# Patient Record
Sex: Male | Born: 1935 | Race: White | Hispanic: No | Marital: Married | Smoking: Former smoker
Health system: Southern US, Community
[De-identification: ages and names within clinical notes are randomized; demographics above are authoritative.]

## PROBLEM LIST (undated history)

## (undated) DIAGNOSIS — Z950 Presence of cardiac pacemaker: Secondary | ICD-10-CM

## (undated) DIAGNOSIS — K4090 Unilateral inguinal hernia, without obstruction or gangrene, not specified as recurrent: Secondary | ICD-10-CM

## (undated) DIAGNOSIS — R32 Unspecified urinary incontinence: Secondary | ICD-10-CM

## (undated) DIAGNOSIS — Z85828 Personal history of other malignant neoplasm of skin: Secondary | ICD-10-CM

## (undated) DIAGNOSIS — E785 Hyperlipidemia, unspecified: Secondary | ICD-10-CM

## (undated) DIAGNOSIS — H409 Unspecified glaucoma: Secondary | ICD-10-CM

## (undated) DIAGNOSIS — G510 Bell's palsy: Secondary | ICD-10-CM

## (undated) DIAGNOSIS — R972 Elevated prostate specific antigen [PSA]: Secondary | ICD-10-CM

## (undated) DIAGNOSIS — I10 Essential (primary) hypertension: Secondary | ICD-10-CM

## (undated) DIAGNOSIS — I495 Sick sinus syndrome: Secondary | ICD-10-CM

## (undated) DIAGNOSIS — K5900 Constipation, unspecified: Secondary | ICD-10-CM

## (undated) HISTORY — DX: Sick sinus syndrome: I49.5

## (undated) HISTORY — DX: Elevated prostate specific antigen (PSA): R97.20

## (undated) HISTORY — DX: Unspecified urinary incontinence: R32

## (undated) HISTORY — DX: Essential (primary) hypertension: I10

## (undated) HISTORY — DX: Constipation, unspecified: K59.00

## (undated) HISTORY — DX: Presence of cardiac pacemaker: Z95.0

## (undated) HISTORY — DX: Hyperlipidemia, unspecified: E78.5

## (undated) HISTORY — DX: Personal history of other malignant neoplasm of skin: Z85.828

## (undated) HISTORY — DX: Unilateral inguinal hernia, without obstruction or gangrene, not specified as recurrent: K40.90

## (undated) HISTORY — DX: Unspecified glaucoma: H40.9

## (undated) HISTORY — DX: Bell's palsy: G51.0

---

## 1995-11-28 HISTORY — PX: HERNIA REPAIR: SHX51

## 2005-06-29 ENCOUNTER — Encounter: Payer: Self-pay | Admitting: Family Medicine

## 2006-04-27 HISTORY — PX: CHOLECYSTECTOMY: SHX55

## 2006-04-27 HISTORY — PX: PACEMAKER PLACEMENT: SHX43

## 2006-04-27 HISTORY — PX: ABDOMINAL AORTIC ANEURYSM REPAIR: SUR1152

## 2006-04-28 ENCOUNTER — Ambulatory Visit: Payer: Self-pay | Admitting: Internal Medicine

## 2006-04-28 ENCOUNTER — Inpatient Hospital Stay (HOSPITAL_COMMUNITY): Admission: EM | Admit: 2006-04-28 | Discharge: 2006-05-11 | Payer: Self-pay | Admitting: Emergency Medicine

## 2006-04-29 ENCOUNTER — Encounter (INDEPENDENT_AMBULATORY_CARE_PROVIDER_SITE_OTHER): Payer: Self-pay | Admitting: Cardiovascular Disease

## 2006-05-01 ENCOUNTER — Encounter: Payer: Self-pay | Admitting: Vascular Surgery

## 2006-05-03 ENCOUNTER — Encounter (INDEPENDENT_AMBULATORY_CARE_PROVIDER_SITE_OTHER): Payer: Self-pay | Admitting: *Deleted

## 2006-05-17 ENCOUNTER — Ambulatory Visit: Payer: Self-pay

## 2006-05-18 ENCOUNTER — Emergency Department (HOSPITAL_COMMUNITY): Admission: EM | Admit: 2006-05-18 | Discharge: 2006-05-18 | Payer: Self-pay | Admitting: Emergency Medicine

## 2006-08-07 ENCOUNTER — Ambulatory Visit: Payer: Self-pay | Admitting: Internal Medicine

## 2007-04-30 ENCOUNTER — Ambulatory Visit: Payer: Self-pay | Admitting: Internal Medicine

## 2007-07-30 ENCOUNTER — Ambulatory Visit: Payer: Self-pay | Admitting: Internal Medicine

## 2007-10-30 ENCOUNTER — Ambulatory Visit: Payer: Self-pay | Admitting: Internal Medicine

## 2007-11-28 HISTORY — PX: PROSTATE SURGERY: SHX751

## 2008-01-29 ENCOUNTER — Ambulatory Visit: Payer: Self-pay | Admitting: Internal Medicine

## 2008-03-23 ENCOUNTER — Ambulatory Visit (HOSPITAL_COMMUNITY): Admission: RE | Admit: 2008-03-23 | Discharge: 2008-03-23 | Payer: Self-pay | Admitting: Urology

## 2008-04-27 ENCOUNTER — Ambulatory Visit: Payer: Self-pay

## 2008-12-18 ENCOUNTER — Ambulatory Visit (HOSPITAL_BASED_OUTPATIENT_CLINIC_OR_DEPARTMENT_OTHER): Admission: RE | Admit: 2008-12-18 | Discharge: 2008-12-19 | Payer: Self-pay | Admitting: Urology

## 2009-01-28 ENCOUNTER — Encounter: Payer: Self-pay | Admitting: Internal Medicine

## 2009-02-02 ENCOUNTER — Ambulatory Visit: Payer: Self-pay | Admitting: Internal Medicine

## 2009-05-05 ENCOUNTER — Ambulatory Visit: Payer: Self-pay | Admitting: Internal Medicine

## 2009-05-12 ENCOUNTER — Encounter: Payer: Self-pay | Admitting: Internal Medicine

## 2009-07-09 DIAGNOSIS — G510 Bell's palsy: Secondary | ICD-10-CM

## 2009-07-09 DIAGNOSIS — E785 Hyperlipidemia, unspecified: Secondary | ICD-10-CM

## 2009-07-09 DIAGNOSIS — Z87898 Personal history of other specified conditions: Secondary | ICD-10-CM | POA: Insufficient documentation

## 2009-07-09 DIAGNOSIS — I10 Essential (primary) hypertension: Secondary | ICD-10-CM

## 2009-07-09 DIAGNOSIS — H409 Unspecified glaucoma: Secondary | ICD-10-CM | POA: Insufficient documentation

## 2009-07-09 DIAGNOSIS — R972 Elevated prostate specific antigen [PSA]: Secondary | ICD-10-CM

## 2009-07-09 HISTORY — DX: Unspecified glaucoma: H40.9

## 2009-07-09 HISTORY — DX: Bell's palsy: G51.0

## 2009-07-09 HISTORY — DX: Hyperlipidemia, unspecified: E78.5

## 2009-07-09 HISTORY — DX: Essential (primary) hypertension: I10

## 2009-07-09 HISTORY — DX: Elevated prostate specific antigen (PSA): R97.20

## 2009-08-04 ENCOUNTER — Ambulatory Visit: Payer: Self-pay | Admitting: Internal Medicine

## 2009-08-06 ENCOUNTER — Ambulatory Visit: Payer: Self-pay | Admitting: Family Medicine

## 2009-08-06 DIAGNOSIS — K5900 Constipation, unspecified: Secondary | ICD-10-CM | POA: Insufficient documentation

## 2009-08-06 DIAGNOSIS — Z85828 Personal history of other malignant neoplasm of skin: Secondary | ICD-10-CM

## 2009-08-06 DIAGNOSIS — I10 Essential (primary) hypertension: Secondary | ICD-10-CM

## 2009-08-06 DIAGNOSIS — K4090 Unilateral inguinal hernia, without obstruction or gangrene, not specified as recurrent: Secondary | ICD-10-CM

## 2009-08-06 DIAGNOSIS — R32 Unspecified urinary incontinence: Secondary | ICD-10-CM

## 2009-08-06 HISTORY — DX: Personal history of other malignant neoplasm of skin: Z85.828

## 2009-08-06 HISTORY — DX: Essential (primary) hypertension: I10

## 2009-08-06 HISTORY — DX: Unilateral inguinal hernia, without obstruction or gangrene, not specified as recurrent: K40.90

## 2009-08-06 HISTORY — DX: Constipation, unspecified: K59.00

## 2009-08-06 HISTORY — DX: Unspecified urinary incontinence: R32

## 2009-08-19 ENCOUNTER — Encounter: Payer: Self-pay | Admitting: Internal Medicine

## 2009-09-02 ENCOUNTER — Ambulatory Visit: Payer: Self-pay | Admitting: Family Medicine

## 2009-09-06 LAB — CONVERTED CEMR LAB
BUN: 21 mg/dL (ref 6–23)
CO2: 40 meq/L — ABNORMAL HIGH (ref 19–32)
Calcium: 9.8 mg/dL (ref 8.4–10.5)
Creatinine, Ser: 1.4 mg/dL (ref 0.4–1.5)
GFR calc non Af Amer: 50.94 mL/min (ref >60)
Potassium: 3.4 meq/L — ABNORMAL LOW (ref 3.5–5.1)

## 2009-10-27 ENCOUNTER — Encounter (INDEPENDENT_AMBULATORY_CARE_PROVIDER_SITE_OTHER): Payer: Self-pay | Admitting: *Deleted

## 2009-11-03 ENCOUNTER — Encounter: Payer: Self-pay | Admitting: Internal Medicine

## 2009-11-11 ENCOUNTER — Encounter: Payer: Self-pay | Admitting: Internal Medicine

## 2010-01-05 ENCOUNTER — Ambulatory Visit: Payer: Self-pay | Admitting: Family Medicine

## 2010-02-22 ENCOUNTER — Ambulatory Visit: Payer: Self-pay | Admitting: Internal Medicine

## 2010-02-22 DIAGNOSIS — Z950 Presence of cardiac pacemaker: Secondary | ICD-10-CM

## 2010-02-22 HISTORY — DX: Presence of cardiac pacemaker: Z95.0

## 2010-05-25 ENCOUNTER — Ambulatory Visit: Payer: Self-pay | Admitting: Internal Medicine

## 2010-06-15 ENCOUNTER — Encounter: Payer: Self-pay | Admitting: Internal Medicine

## 2010-07-06 ENCOUNTER — Ambulatory Visit: Payer: Self-pay | Admitting: Family Medicine

## 2010-08-25 ENCOUNTER — Ambulatory Visit: Payer: Self-pay | Admitting: Internal Medicine

## 2010-10-12 ENCOUNTER — Encounter: Payer: Self-pay | Admitting: Internal Medicine

## 2010-12-01 ENCOUNTER — Encounter: Payer: Self-pay | Admitting: Internal Medicine

## 2010-12-01 ENCOUNTER — Ambulatory Visit
Admission: RE | Admit: 2010-12-01 | Discharge: 2010-12-01 | Payer: Self-pay | Source: Home / Self Care | Attending: Internal Medicine | Admitting: Internal Medicine

## 2010-12-20 ENCOUNTER — Encounter: Payer: Self-pay | Admitting: Family Medicine

## 2010-12-24 ENCOUNTER — Encounter: Payer: Self-pay | Admitting: Family Medicine

## 2010-12-27 NOTE — Cardiovascular Report (Signed)
Summary: Office Visit Remote   Office Visit Remote   Imported By: Roderic Ovens 06/16/2010 11:00:26  _____________________________________________________________________  External Attachment:    Type:   Image     Comment:   External Document

## 2010-12-27 NOTE — Assessment & Plan Note (Signed)
Summary: 4 MNTH ROV//SLM   Vital Signs:  Patient profile:   77 year old male Weight:      158 pounds Temp:     97.6 degrees F oral BP sitting:   140 / 58  (left arm) Cuff size:   regular  Vitals Entered By: Sid Falcon LPN (January 05, 2010 1:23 PM) CC: 4 month follow-up, Hypertension Management   History of Present Illness: Followup hypertension. Tolerating medications well. No orthostatic symptoms. Denies any palpitations or chest pain. Long history of recurrent skin cancers. Couple of places that have come up in the past month right thigh and right leg. Sees dermatologist monthly  Hypertension History:      He denies headache, chest pain, palpitations, dyspnea with exertion, orthopnea, PND, peripheral edema, visual symptoms, neurologic problems, syncope, and side effects from treatment.        Positive major cardiovascular risk factors include male age 41 years old or older, hyperlipidemia, and hypertension.  Negative major cardiovascular risk factors include non-tobacco-user status.     Allergies (verified): No Known Drug Allergies  Past History:  Past Medical History: Last updated: 08/06/2009 Skin cancer, hx of Hypertension Urinary incontinence Prostate cancer Fainting spells Glaucoma Blood Transfusion Heart murmur PMH reviewed for relevance  Review of Systems      See HPI  Physical Exam  General:  Well-developed,well-nourished,in no acute distress; alert,appropriate and cooperative throughout examination Neck:  No deformities, masses, or tenderness noted. Lungs:  Normal respiratory effort, chest expands symmetrically. Lungs are clear to auscultation, no crackles or wheezes. Heart:  heart sounds somewhat distant but regular with a rate around 70 Skin:  patient has fairly large eschar right lower leg and nodular erythematous lesion right lateral thigh with somewhat ulcerative center   Impression & Recommendations:  Problem # 1:  HYPERTENSION  (ICD-401.9) Assessment Unchanged  His updated medication list for this problem includes:    Cardura 8 Mg Tabs (Doxazosin mesylate) .Marland Kitchen... 1/2 tab daily  Problem # 2:  SKIN CANCER, HX OF (ICD-V10.83) patient has a couple of concerning lesions right leg and right thigh. He is encouraged to his dermatologist the next few weeks  Complete Medication List: 1)  Cardura 8 Mg Tabs (Doxazosin mesylate) .... 1/2 tab daily 2)  Timoptic-xe 0.25 % Solg (Timolol maleate) .... One drop to each eye daily 3)  Inderide 40/25  .... One tab daily  Hypertension Assessment/Plan:      The patient's hypertensive risk group is category B: At least one risk factor (excluding diabetes) with no target organ damage.  Today's blood pressure is 140/58.    Patient Instructions: 1)  Please schedule a follow-up appointment in 6 months .  2)  Schedule follow up with your dermatologist within the next few weeks

## 2010-12-27 NOTE — Letter (Signed)
Summary: Remote Device Check  [facility], Main Office  [address]   [place], [place] [address]   Phone: [phone]  Fax: [phone]     June 15, 2010 MRN: [id number]   SHAWNMICHAEL PARENTEAU [address] [place], [place]  [address]   Dear Mr. CRIST,   Your remote transmission was recieved and reviewed by your physician.  All diagnostics were within normal limits for you.  __X___Your next transmission is scheduled for:  08-25-2010.  Please transmit at any time this day.  If you have a wireless device your transmission will be sent automatically.   Sincerely,  Vella Kohler

## 2010-12-27 NOTE — Assessment & Plan Note (Signed)
Summary: 6 month fup//ccm   Vital Signs:  Patient profile:   77 year old male Height:      66.5 inches Weight:      162 pounds BMI:     25.85 Temp:     98.1 degrees F oral Pulse rate:   78 / minute BP sitting:   110 / 70  (left arm) BP standing:   122 / 60 Cuff size:   regular  Vitals Entered By: Romualdo Bolk, CMA (AAMA) (July 06, 2010 1:10 PM)  Serial Vital Signs/Assessments:  Time      Position  BP       Pulse  Resp  Temp     By           Standing  122/60                         Evelena Peat MD  CC: follow-up visit, Hypertension Management   History of Present Illness: Patient here for followup hypertension. He takes Inderide and Cardura. His Cardura was actually started for BPH issues. No orthostasis. Had recent elevated systolic blood pressures through cardiologist's office but has not had hx of signif elevation. Generally he feels well no dizziness or chest pains.  Hypertension History:      He denies headache, chest pain, palpitations, dyspnea with exertion, orthopnea, PND, peripheral edema, visual symptoms, neurologic problems, syncope, and side effects from treatment.  He notes no problems with any antihypertensive medication side effects.        Positive major cardiovascular risk factors include male age 68 years old or older, hyperlipidemia, and hypertension.  Negative major cardiovascular risk factors include non-tobacco-user status.     Preventive Screening-Counseling & Management  Alcohol-Tobacco     Alcohol drinks/day: 0     Smoking Status: quit     Year Quit: 1980  Caffeine-Diet-Exercise     Caffeine use/day: 0     Does Patient Exercise: yes  Current Medications (verified): 1)  Cardura 8 Mg Tabs (Doxazosin Mesylate) .... 1/2 Tab Daily 2)  Timoptic-Xe 0.25 % Solg (Timolol Maleate) .... One Drop To Each Eye Daily 3)  Inderide 40/25 .... One Tab Daily 4)  Fish Oil 1200 Mg Caps (Omega-3 Fatty Acids) .... Take One Capsule Three Times A Day 5)   Vitamin E 1000 Unit Caps (Vitamin E) .... Take One Capusle Daily 6)  Fergon 240 (27 Fe) Mg Tabs (Ferrous Gluconate) .... Take One Tablet Once Daily 7)  Potassium 99 Mg Tabs (Potassium) .... Take One Tablet Once Daily  Allergies (verified): No Known Drug Allergies  Past History:  Past Medical History: Last updated: 08/06/2009 Skin cancer, hx of Hypertension Urinary incontinence Prostate cancer Fainting spells Glaucoma Blood Transfusion Heart murmur PMH reviewed for relevance  Social History: Caffeine use/day:  0 Does Patient Exercise:  yes  Review of Systems      See HPI  Physical Exam  General:  Well-developed,well-nourished,in no acute distress; alert,appropriate and cooperative throughout examination Neck:  No deformities, masses, or tenderness noted. Lungs:  Normal respiratory effort, chest expands symmetrically. Lungs are clear to auscultation, no crackles or wheezes. Heart:  normal rate and regular rhythm.   Extremities:  no edema   Impression & Recommendations:  Problem # 1:  HYPERTENSION (ICD-401.9) adequately controlled by today's reading. I am reluctant to increase his medication further based on systolic reading today the 120 range and increased risk of orthostasis. He remains on Inderide in addition to  his Cardura His updated medication list for this problem includes:    Cardura 8 Mg Tabs (Doxazosin mesylate) .Marland Kitchen... 1/2 tab daily  Complete Medication List: 1)  Cardura 8 Mg Tabs (Doxazosin mesylate) .... 1/2 tab daily 2)  Timoptic-xe 0.25 % Solg (Timolol maleate) .... One drop to each eye daily 3)  Inderide 40/25  .... One tab daily 4)  Fish Oil 1200 Mg Caps (Omega-3 fatty acids) .... Take one capsule three times a day 5)  Vitamin E 1000 Unit Caps (Vitamin e) .... Take one capusle daily 6)  Fergon 240 (27 Fe) Mg Tabs (Ferrous gluconate) .... Take one tablet once daily 7)  Potassium 99 Mg Tabs (Potassium) .... Take one tablet once daily  Hypertension  Assessment/Plan:      The patient's hypertensive risk group is category B: At least one risk factor (excluding diabetes) with no target organ damage.  Today's blood pressure is 110/70.    Patient Instructions: 1)  Please schedule a follow-up appointment in 6 months .

## 2010-12-27 NOTE — Letter (Signed)
Summary: Remote Device Check  [facility], Main Office  [address]   [place], [place] [address]   Phone: [phone]  Fax: [phone]     October 12, 2010 MRN: [id number]   Shane Simpson [address] [place], [place]  [address]   Dear [name],   Your remote transmission was recieved and reviewed by your physician.  All diagnostics were within normal limits for you.  __X___Your next transmission is scheduled for:   12-01-2010.  Please transmit at any time this day.  If you have a wireless device your transmission will be sent automatically.  Sincerely,  Vella Kohler

## 2010-12-27 NOTE — Cardiovascular Report (Signed)
Summary: Office Visit Remote   Office Visit Remote   Imported By: Roderic Ovens 10/14/2010 14:10:39  _____________________________________________________________________  External Attachment:    Type:   Image     Comment:   External Document

## 2010-12-27 NOTE — Assessment & Plan Note (Signed)
Summary: pc2/ gd   CC:  device check.  History of Present Illness:   Mr. Doss is seen in followup for sinus node dysfunction associated with syncope. He is status post pacemaker implantation has had no recurrences. He denies chest pain shortness of breath or exercise intolerance.  Hypertension History:      Positive major cardiovascular risk factors include male age 75 years old or older, hyperlipidemia, and hypertension.  Negative major cardiovascular risk factors include non-tobacco-user status.     Current Medications (verified): 1)  Cardura 8 Mg Tabs (Doxazosin Mesylate) .... 1/2 Tab Daily 2)  Timoptic-Xe 0.25 % Solg (Timolol Maleate) .... One Drop To Each Eye Daily 3)  Inderide 40/25 .... One Tab Daily 4)  Fish Oil 1200 Mg Caps (Omega-3 Fatty Acids) .... Take One Capsule Three Times A Day 5)  Vitamin E 1000 Unit Caps (Vitamin E) .... Take One Capusle Daily 6)  Fergon 240 (27 Fe) Mg Tabs (Ferrous Gluconate) .... Take One Tablet Once Daily 7)  Potassium 99 Mg Tabs (Potassium) .... Take One Tablet Once Daily  Allergies (verified): No Known Drug Allergies  Past History:  Past Medical History: Last updated: 08/06/2009 Skin cancer, hx of Hypertension Urinary incontinence Prostate cancer Fainting spells Glaucoma Blood Transfusion Heart murmur  Past Surgical History: Last updated: 08/06/2009 Pacemaker 6/07 AAA Repair 6/07 Cholecystectomy 6/07 L Inguinal Herniorrhapy 1997 Prostate cancer 2009  Family History: Last updated: 08/06/2009 Family History of Arthritis Family History Breast cancer 1st degree relative <50 Family History Hypertension Family history heart disease Family history stroke  Social History: Last updated: 08/06/2009 Retired Married Alcohol use-no Previous smoker, quit 1982  Vital Signs:  Patient profile:   77 year old male Height:      66.5 inches Weight:      159 pounds Pulse rate:   79 / minute Pulse rhythm:   regular BP sitting:    152 / 60  (left arm) Cuff size:   large  Vitals Entered By: Judithe Modest CMA (February 22, 2010 12:28 PM)  Physical Exam  General:  The patient was alert and oriented in no acute distress. HEENT Normal.  Neck veins were flat, carotids were brisk.  Lungs were clear.  Heart sounds were regular without murmurs or gallops.  Abdomen was soft with active bowel sounds. There is no clubbing cyanosis or edema. Skin Warm and dry with multiple excoriations which relate to skin cancer lesions    PPM Specifications Following MD:  Sherryl Manges, MD     PPM Vendor:  [employer]     PPM Model Number:  [id number]     PPM Serial Number:  [id number] PPM DOI:  05/02/2006     PPM Implanting MD:  Sherryl Manges, MD  Lead 1    Location: RA     DOI: 05/02/2006     Model #: [id number]     Serial #: [id number]     Status: active Lead 2    Location: RV     DOI: 05/02/2006     Model #: [id number]     Serial #: [id number]     Status: active  Magnet Response Rate:  BOL 85 ER  65  Indications:  Syncope with pauses  Explantation Comments:  [facility]  PPM Follow Up Remote Check?  No Battery Voltage:  2.78 V     Battery Est. Longevity:  6.5 years     Pacer Dependent:  No       PPM Device Measurements Atrium  Amplitude: 4.0  mV, Impedance: 573 ohms, Threshold: 0.625 V at 0.4 msec Right Ventricle  Amplitude: 5.6 mV, Impedance: 684 ohms, Threshold: 0.625 V at 0.4 msec  Episodes MS Episodes:  1     Percent Mode Switch:  <0.1%     Coumadin:  No Ventricular High Rate:  0     Atrial Pacing:  72.3%     Ventricular Pacing:  0.3%  Parameters Mode:  DDDR     Lower Rate Limit:  60     Upper Rate Limit:  130 Paced AV Delay:  150     Sensed AV Delay:  120 Next Remote Date:  05/25/2010     Next Cardiology Appt Due:  01/26/2011 Tech Comments:  No parameter changes.  Device function normal.  Checked by industry.  [facility] transmissions every 3 months.  ROV 1 year with Dr. Graciela Husbands. Altha Harm, LPN  February 22, 2010 12:54 PM   Impression &  Recommendations:  Problem # 1:  PACEMAKER, PERMANENT (ICD-V45.01) Device parameters and data were reviewed and no changes were made  Problem # 2:  SINUS NODE DYSFUNCTION (ICD-427.81) 80% atrial paced 0% ventricular pacing  Problem # 3:  ESSENTIAL HYPERTENSION, BENIGN (ICD-401.1) Is only on Cardura for his blood pressure systolics remain elevated a definite follow up about this with Dr. Caryl Never His updated medication list for this problem includes:    Cardura 8 Mg Tabs (Doxazosin mesylate) .Marland Kitchen... 1/2 tab daily  Hypertension Assessment/Plan:      The patient's hypertensive risk group is category B: At least one risk factor (excluding diabetes) with no target organ damage.  Today's blood pressure is 152/60.

## 2011-01-02 ENCOUNTER — Ambulatory Visit (INDEPENDENT_AMBULATORY_CARE_PROVIDER_SITE_OTHER): Payer: Medicare Other | Admitting: Family Medicine

## 2011-01-02 ENCOUNTER — Encounter: Payer: Self-pay | Admitting: Family Medicine

## 2011-01-02 DIAGNOSIS — I10 Essential (primary) hypertension: Secondary | ICD-10-CM

## 2011-01-02 DIAGNOSIS — Z87898 Personal history of other specified conditions: Secondary | ICD-10-CM

## 2011-01-02 NOTE — Progress Notes (Signed)
  Subjective:    Patient ID: Shane Simpson, male    DOB: 1920/01/22, 77 y.o.   MRN: [id number]  HPI  Patient seen for followup hypertension and BPH. Recent switch in blood pressure medication from Inderal to hydrochlorothiazide and metoprolol secondary to cost issues. No problems since switch of medications. No orthostatic symptoms Compliant with all medications. Denies any recent chest pains.  No significant obstructive urinary symptoms. No orthostasis with doxazosin.  Patient has history of pacemaker and has scheduled followup for reassessment of March. Review of Systems     Objective:   Physical Exam Patient is alert and in no distress Oropharynx is clear and moist mucosa Neck is supple no mass Chest clear to auscultation Heart regular rhythm and rate Extremities no significant pitting edema Neuro he has residual deficits from Bell's palsy. No focal extremity strength deficits       Assessment & Plan:  #1  hypertension stable #2 BPH symptomatically stable

## 2011-01-02 NOTE — Patient Instructions (Signed)
Call back for appointment when you can schedule when your wife is seen.

## 2011-01-08 ENCOUNTER — Encounter (INDEPENDENT_AMBULATORY_CARE_PROVIDER_SITE_OTHER): Payer: Self-pay | Admitting: *Deleted

## 2011-01-18 NOTE — Letter (Signed)
Summary: Remote Device Check  [facility], Main Office  [address]   [place], [place] [address]   Phone: [phone]  Fax: [phone]     January 08, 2011 MRN: [id number]   SEMISI BIELA [address] [place], [place]  [address]   Dear Mr. MARLETT,   Your remote transmission was recieved and reviewed by your physician.  All diagnostics were within normal limits for you.  _____Your next transmission is scheduled for:                       .  Please transmit at any time this day.  If you have a wireless device your transmission will be sent automatically.  ___X___Your next office visit is scheduled for:  02-01-11 @ 215pm with Dr Graciela Husbands.   Sincerely,  Vella Kohler

## 2011-01-18 NOTE — Cardiovascular Report (Signed)
Summary: Office Visit Remote   Office Visit Remote   Imported By: Roderic Ovens 01/10/2011 15:28:20  _____________________________________________________________________  External Attachment:    Type:   Image     Comment:   External Document

## 2011-02-01 ENCOUNTER — Encounter: Payer: Self-pay | Admitting: Internal Medicine

## 2011-02-21 ENCOUNTER — Ambulatory Visit (INDEPENDENT_AMBULATORY_CARE_PROVIDER_SITE_OTHER): Payer: Medicare Other | Admitting: Internal Medicine

## 2011-02-21 ENCOUNTER — Encounter: Payer: Self-pay | Admitting: Internal Medicine

## 2011-02-21 DIAGNOSIS — I495 Sick sinus syndrome: Secondary | ICD-10-CM

## 2011-02-21 DIAGNOSIS — Z950 Presence of cardiac pacemaker: Secondary | ICD-10-CM

## 2011-02-21 DIAGNOSIS — R55 Syncope and collapse: Secondary | ICD-10-CM

## 2011-02-21 DIAGNOSIS — I10 Essential (primary) hypertension: Secondary | ICD-10-CM

## 2011-02-21 NOTE — Assessment & Plan Note (Signed)
The patient's device was interrogated.  The information was reviewed. No changes were made in the programming.    

## 2011-02-21 NOTE — Assessment & Plan Note (Addendum)
The patient is paced in the atrium 80% of the time mostly in his lower rate limit of 60.-stable

## 2011-02-21 NOTE — Assessment & Plan Note (Signed)
Stable on current meds 

## 2011-02-21 NOTE — Patient Instructions (Signed)
Your physician recommends that you schedule a follow-up appointment in: year with dr klein  

## 2011-02-21 NOTE — Progress Notes (Signed)
  HPI   Shane Simpson is seen in followup for sinus node dysfunction associated with syncope. He is status post pacemaker implantation has had no recurrences. He denies chest pain shortness of breath or exercise intolerance.  His biggest problem is low back pain. Apparently he has had a marked up surge in his PSA following a prior prostate surgery.The patient in his discussions with Dr. Vonita Moss was adamant about no further surgery. Since then the back pain has developed.  Past Medical History  Diagnosis Date  . HYPERLIPIDEMIA 07/09/2009  . BELLS PALSY 07/09/2009  . GLUCOMA 07/09/2009  . Essential hypertension, benign 07/09/2009  . HYPERTENSION 08/06/2009  . INGUINAL HERNIA, RIGHT 08/06/2009  . CONSTIPATION 08/06/2009  . URINARY INCONTINENCE 08/06/2009  . PSA, INCREASED 07/09/2009  . SKIN CANCER, HX OF 08/06/2009  . BENIGN PROSTATIC HYPERTROPHY, HX OF 07/09/2009  . PACEMAKER, PERMANENT 02/22/2010    Past Surgical History  Procedure Date  . Pacemaker placement 04/2006  . Abdominal aortic aneurysm repair 04/2006  . Cholecystectomy 04/2006  . Hernia repair 1997    Left inguinal  . Prostate surgery 2009    Cancer    Current Outpatient Prescriptions  Medication Sig Dispense Refill  . doxazosin (CARDURA) 8 MG tablet Take 8 mg by mouth. 1/2 tab daily       . ferrous gluconate (FERGON) 240 (27 FE) MG tablet Take 240 mg by mouth daily.        . hydrochlorothiazide 25 MG tablet Take 25 mg by mouth daily.        . metoprolol (LOPRESSOR) 50 MG tablet Take 50 mg by mouth 2 (two) times daily.        . Omega-3 Fatty Acids (FISH OIL) 1200 MG CAPS Take 1,200 mg by mouth 3 (three) times daily.        . Potassium 99 MG TABS Take 99 mg by mouth daily.        . timolol (TIMOPTIC-XE) 0.25 % ophthalmic gel-forming Place 1 drop into both eyes daily.        . vitamin E 1000 UNIT capsule Take 1,000 Units by mouth daily.          No Known Allergies  Review of Systems negative except from HPI and PMH  Physical  Exam Well developed and well nourished in no acute distress HENT normal E scleral and icterus clear Neck Supple JVP flat; carotids brisk and full Clear to ausculation Regular rate and rhythm, no murmurs gallops or rub Soft with active bowel sounds No clubbing cyanosis and edema Alert and oriented, grossly normal motor and sensory function Skin Warm and Dry  ECG  Assessment and  Plan

## 2011-02-28 ENCOUNTER — Ambulatory Visit (INDEPENDENT_AMBULATORY_CARE_PROVIDER_SITE_OTHER): Payer: Medicare Other | Admitting: Internal Medicine

## 2011-02-28 ENCOUNTER — Encounter: Payer: Self-pay | Admitting: Internal Medicine

## 2011-02-28 DIAGNOSIS — J069 Acute upper respiratory infection, unspecified: Secondary | ICD-10-CM

## 2011-02-28 DIAGNOSIS — I10 Essential (primary) hypertension: Secondary | ICD-10-CM

## 2011-02-28 NOTE — Patient Instructions (Signed)
Get plenty of rest, Drink lots of  clear liquids, and use Tylenol or ibuprofen for fever and discomfort.    Call if  You  Develop productive cough chest pain shortness of breath or fever with chills   Use Mucinex twice daily

## 2011-02-28 NOTE — Progress Notes (Signed)
  Subjective:    Patient ID: Shane Simpson, male    DOB: 01/09/1920, 77 y.o.   MRN: [id number]  HPI   77 year old patient who is seen today with a chief complaint of fever and chest congestion. He has had some chest congestion for the past 4 days and earlier today temperature was as high as 101. He has taken Tylenol with resolution of his fever he has minimal cough which has been nonproductive;  he feels slightly fatigued but has not been extremely ill. He states he feels improved today compared to yesterday; denies any chills his appetite has been well maintained.  There was some mild sore throat earlier that has resolved. His oral intake and fluid intake has been stable Review of Systems  Constitutional: Positive for fever and fatigue. Negative for chills, diaphoresis and appetite change.  HENT: Negative for hearing loss, ear pain, congestion, sore throat, trouble swallowing, neck stiffness, dental problem, voice change and tinnitus.   Eyes: Negative for pain, discharge and visual disturbance.  Respiratory: Positive for cough. Negative for chest tightness, shortness of breath, wheezing and stridor.   Cardiovascular: Negative for chest pain, palpitations and leg swelling.  Gastrointestinal: Negative for nausea, vomiting, abdominal pain, diarrhea, constipation, blood in stool and abdominal distention.  Genitourinary: Negative for urgency, hematuria, flank pain, discharge, difficulty urinating and genital sores.  Musculoskeletal: Negative for myalgias, back pain, joint swelling, arthralgias and gait problem.  Skin: Negative for rash.  Neurological: Negative for dizziness, syncope, speech difficulty, weakness, numbness and headaches.  Hematological: Negative for adenopathy. Does not bruise/bleed easily.  Psychiatric/Behavioral: Negative for behavioral problems and dysphoric mood. The patient is not nervous/anxious.        Objective:   Physical Exam  Constitutional: He is oriented to person,  place, and time. He appears well-developed.  HENT:  Head: Normocephalic.  Right Ear: External ear normal.  Left Ear: External ear normal.  Mouth/Throat: Oropharynx is clear and moist.  Eyes: Conjunctivae and EOM are normal.  Neck: Normal range of motion. Neck supple.  Cardiovascular: Normal rate and regular rhythm.   Pulmonary/Chest: Effort normal and breath sounds normal.       Breath sounds were slightly diminished left base but chest was clear. There is no increased work of breathing  O2 saturation 97%  Abdominal: Bowel sounds are normal.  Musculoskeletal: Normal range of motion. He exhibits no edema and no tenderness.  Neurological: He is alert and oriented to person, place, and time.  Psychiatric: He has a normal mood and affect. His behavior is normal.          Assessment & Plan:  Viral URI. Patient feels clinically improved today compared to yesterday his temperature has now normalized. We'll continue Tylenol and Mucinex for fluids and clinically observe for at least 24 hours. If he develops any clinical worsening we'll consider a chest x-ray and prompt followup

## 2011-03-13 LAB — CBC
MCHC: 34 g/dL (ref 30.0–36.0)
MCV: 93.3 fL (ref 78.0–100.0)
RDW: 14.1 % (ref 11.5–15.5)
WBC: 8.8 10*3/uL (ref 4.0–10.5)

## 2011-03-13 LAB — DIFFERENTIAL
Eosinophils Absolute: 0.1 10*3/uL (ref 0.0–0.7)
Lymphocytes Relative: 14 % (ref 12–46)
Lymphs Abs: 1.2 10*3/uL (ref 0.7–4.0)
Monocytes Absolute: 0.6 10*3/uL (ref 0.1–1.0)
Neutro Abs: 6.8 10*3/uL (ref 1.7–7.7)

## 2011-03-13 LAB — POCT I-STAT 4, (NA,K, GLUC, HGB,HCT): Hemoglobin: 11.9 g/dL — ABNORMAL LOW (ref 13.0–17.0)

## 2011-04-11 NOTE — Assessment & Plan Note (Signed)
[facility]                         ELECTROPHYSIOLOGY OFFICE NOTE   NAME:TUTTLELiban, Guedes                     MRN:          [id number]  DATE:04/30/2007                            DOB:          [birth year]    REFERRING PHYSICIAN:  [name], MD, [facility]   Mr. Pullara comes in.  He has a history of syncope with pauses.  He had  no recurrent syncope, following pacemaker implantation a year ago.   He has had some possible shortness of breath but is much better now than  a week ago, and this is recurrent in the setting of some type of  bronchitic-like episode.   MEDICATIONS:  Cardura and Inderal, the dose unknown, and  hydrochlorothiazide, he does not know the doses.   PHYSICAL EXAMINATION:  VITAL SIGNS:  His blood pressure is mildly  elevated at 150/60.  His pulse was 74.  LUNGS:  Clear.  EXTREMITIES:  Without edema.   Interrogation of his Medtronic Versa pulse generator demonstrates a P-  wave of 4, an impedance of 581, threshold of 0.5 at 0.5, the R-waves 5.6  with impedence of 584 and threshold of 0.75 at 0.4.  Battery voltage is  2.79.   IMPRESSION:  1. Sinus node dysfunction.  2. Syncope.  3. Status post pacer for the above.  4. Shortness of breath probably related to resolving bronchitis.  5. Hypertension - somewhat anomalous by history, but patient is on      Inderal and hydrochlorothiazide.   He is going to check his blood pressure over the next couple of weeks at  the local pharmacy and let us know how he is doing.  He does not have  primary care physician.  We will plan to follow his device remotely.  We  will see him again in one year's time.     [name], MD, [facility]  Electronically Signed    SCK/MedQ  DD: 04/30/2007  DT: 04/30/2007  Job #: [id number]

## 2011-04-11 NOTE — Op Note (Signed)
NAME:  REA, RESER NO.:  [id number]   MEDICAL RECORD NO.:  [id number]          PATIENT TYPE:  AMB   LOCATION:  NESC                         FACILITY:  [facility]   PHYSICIAN:  Maretta Bees. Vonita Moss, M.D.DATE OF BIRTH:  February 12, 1920   DATE OF PROCEDURE:  12/18/2008  DATE OF DISCHARGE:                               OPERATIVE REPORT   PREOPERATIVE DIAGNOSIS:  Carcinoma of the prostate.   POSTOPERATIVE DIAGNOSIS:  Carcinoma of the prostate.   PROCEDURE:  Suprapubic tube insertion and cryoablation of the prostate.   SURGEON:  Clent Ridges, MD   ANESTHESIA:  General.   INDICATIONS:  This gentleman was diagnosed with Gleason 8 carcinoma of  the prostate by Dr. Annabell Howells and he had a large prostate that was downsized  with Trelstar.  Dr. Annabell Howells counseled him about treatment options and the  patient elected to have cryotherapy and then was sent to me for that  procedure.  He was counseled about the need for a suprapubic tube and  risk of retention, hemorrhage, incontinence or failure of therapy.  He  is on Cardura for bladder outlet obstructive symptoms and the patient  had a flow rate with a fairly decent bell shaped curve and a mean flow  of 10 mL a second and a PVR of 11 mL.   PROCEDURE IN DETAIL:  The patient was brought to the operating room and  placed in lithotomy position.  The lower abdomen and external genitalia  prepped and draped in the usual fashion.  He was cystoscoped and he had  bilobar hypertrophy and no intravesical lesions.  Under visual control  with the bladder filled with an irrigating solution a stab wound was  made in the midline suprapubically through which a percutaneous  suprapubic tube was placed between the air bubble and the bladder neck  and a full coil placed in the SP tube and it was pulled up and connected  to drainage.  Actually during the procedure it was plugged off to allow  the bladder to fill.  At this point a transrectal ultrasound  probe was  placed and treatment planning undertaken.  Treatment needles were placed  on the top row with one needle in left lobe and one in the right;  likewise a second row of needles was placed with one left and one right  and a bottom row with three needles all of which were a centimeter away  from each other and away from the urethra and rectum.  Prostatic volume  was 33 mL.  A temperature sensing probe was placed in Denonvilliers  fascia in mid prostate and another thermal sensing probe was placed in  the external sphincter.  He was recystoscoped and over a stiff guidewire  the urethral warming catheter was placed in good position.  He then  underwent two nice freeze - thaw cycles with good ice ball formation  with a flattening of the midline peak.  The  Denonvilliers fascia and external sphincter were kept at satisfactory  temperature levels.  He then underwent a 20 minute thaw cycle and was  taken to  the recovery room with clear irrigation from his SP tube,  having tolerated the procedure well.      Maretta Bees. Vonita Moss, M.D.  Electronically Signed     LJP/MEDQ  D:  12/18/2008  T:  12/18/2008  Job:  [id number]

## 2011-04-11 NOTE — Letter (Signed)
February 02, 2009    Ricki Rodriguez, MD  [address], [place] [address]   RE:  Shane Simpson, Shane Simpson  MRN:  [id number]  /  DOB:  1920-11-18   Dear Dr. Algie Coffer,   [name] comes in for followup of pacemaker implanted for syncope and  pauses.  He has complaints of shortness of breath with significant  exertion.  He does not have shortness of breath at rest or with walking  around.   His medication list is limited;  1. Cardura-4.  2. Timoptic.  3. Inderide 40/25.   On examination, his blood pressure was not too better 141/63, the pulse  was 71, his weight was 158, which is stable.  His lungs were clear.  His  heart sounds were regular.  Neck veins were flat.  The extremities were  without edema.   Interrogation of Medtronic VERSA pulse generator demonstrates P-wave of  2.8 with impedance of 564, threshold was 0.6 at 0.4.  The R-wave was 8  with impedance of 606 with threshold was 0.6 at 0.4.  Battery voltage  2.77.  His heart rate excursion was notable for somewhat of a right  shift with about 3-5% of his heart beats at faster than 100 beats per  minute.  Because of this, we reprogrammed his rate response to decrease  his AVR rate, to decrease his upper sensor rate, and decrease his slope.   IMPRESSION:  1. Sinus node dysfunction.  2. Status post pacer for the above.  3. Exercise intolerance with moderate exertion, question related to      overzealous pacing programming.  4. Hypertension.   [name] is doing pretty well.  I have reprogrammed his device as  noted.  He walked around the office and had no untoward symptoms.  I  hope that he will be fine.  I have asked him to let us know if he has  any significant symptoms.    Sincerely,      [name], MD, [facility]  Electronically Signed    SCK/MedQ  DD: 02/02/2009  DT: 02/03/2009  Job #: [id number]

## 2011-04-14 NOTE — Op Note (Signed)
NAME:  Shane Simpson, Shane Simpson NO.:  [id number]   MEDICAL RECORD NO.:  [id number]          PATIENT TYPE:  INP   LOCATION:  2550                         FACILITY:  [facility]   PHYSICIAN:  Larina Earthly, M.D.    DATE OF BIRTH:  [birth year]   DATE OF PROCEDURE:  05/03/2006  DATE OF DISCHARGE:                                 OPERATIVE REPORT   PREOPERATIVE DIAGNOSIS:  Large abdominal aortic aneurysm.   POSTOPERATIVE DIAGNOSIS:  Large abdominal aortic aneurysm.   OPERATION PERFORMED:  1.  Resection and grafting of abdominal aortic aneurysm.  Repair with a 16 x      8 Hemashield Diatek bypass from aorta to bilateral common iliac      arteries.  2.  Cholecystectomy, which will be dictated by Maisie Fus A. Cornett, M.D. as a      separate note.   SURGEON:  Larina Earthly, M.D.   ASSISTANT:  Rowe Clack, P.A.-C.   ANESTHESIA:  General endotracheal.   COMPLICATIONS:  None.   DISPOSITION:  Recovery room neurologically intact and extubated.   DESCRIPTION OF PROCEDURE:  The patient was taken to the operating room and  placed in supine position where the area of the abdomen and both groins was  prepped and draped in the usual sterile fashion.  Incision made from the  level of the xiphoid to below the umbilicus, carried down through the  midline fascia with electrocautery.  The abdomen was entered and the Omni-  Tract retractor was used for exposure.  The bowel was reflected to the right  and the transverse colon and omentum were reflected superiorly.  The patient  had a very large greater than 10 cm aneurysm.  The gallbladder was noted to  be adhered to the omentum and appeared to be chronically inflamed and  thickened.  Dr. Luisa Hart did an intraoperative consult and agreed that the  patient should undergo cholecystectomy at the completion of the aneurysm  repair and this was done and dictated as a separate note.  The  retroperitoneum was opened and the aorta was encircled at the  level of the  renal arteries and was of normal caliber.  The iliac arteries were encircled  bilaterally and were also of normal caliber.  They were extensively  calcified and dissected was extended further into the pelvis to an area that  was adequate for anastomosis.  The patient was given 8000 units intravenous  heparin, 25 g of mannitol.  After adequate circulation time, the aorta was  occluded below the level of the renal arteries and the common iliac arteries  were occluded bilaterally.  The aneurysm was opened and a large amount of  thrombus was removed.  The aorta was transected below the proximal clamp, 16  x 8 Hemashield graft was brought onto the field.  Using a felt strip for  reinforcement, it was sewn end-to-end to the aorta with a running 3-0  Prolene suture.  This was tested and found to be adequate.  Next, the right  and left limbs of the graft were cut to the appropriate length and  were sewn  end-to-end to the respective common iliac arteries bilaterally with running  5-0 Prolene sutures.  Prior to completion of each anastomosis, the usual  flushing maneuvers were undertaken and anastomosis was completed.  Flow was  restored to both limbs.  The patient was given 50 mg of protamine to reverse  heparin.  The wounds were irrigated with saline, hemostasis with  electrocautery.  Wounds were closed by reapproximating the large aneurysm  sac over the graft with a running 2-0 Vicryl suture.  The retroperitoneum  was closed with running 2-0 Vicryl suture.  Next, Dr. Luisa Hart performed open  cholecystectomy without any complications.  Ths was irrigated and hemostasis  was obtained with electrocautery.  The small bowel was placed back in the  pelvis.  Transverse colon and omentum were placed over this.  The midline  fascia was closed with a #1 PDS suture beginning proximally and distally and  tying in the middle.  Skin was closed with skin clips.  Sterile dressing was  applied.  The  patient was awakened and extubated in the operating room and  was transferred to the recovery room in stable condition.      Larina Earthly, M.D.  Electronically Signed     TFE/MEDQ  D:  05/03/2006  T:  05/04/2006  Job:  [id number]   cc:   Duncan Dull, M.D.  Fax: [phone]   Clovis Pu. Cornett, M.D.  [address]  [place] [address]

## 2011-04-14 NOTE — Op Note (Signed)
NAME:  Shane Simpson, Shane Simpson NO.:  [id number]   MEDICAL RECORD NO.:  [id number]          PATIENT TYPE:  INP   LOCATION:  2399                         FACILITY:  [facility]   PHYSICIAN:  Clovis Pu. Cornett, M.D.DATE OF BIRTH:  1920-10-15   DATE OF PROCEDURE:  05/03/2006  DATE OF DISCHARGE:                                 OPERATIVE REPORT   PREOPERATIVE DIAGNOSIS:  Cholecystitis acute.   POSTOPERATIVE DIAGNOSIS:  Cholecystitis acute.   PROCEDURE:  Open cholecystectomy.   SURGEON:  Maisie Fus A. Cornett, M.D.   ASSISTANT:  Larina Earthly, M.D.   ANESTHESIA:  General endotracheal anesthesia.   ESTIMATED BLOOD LOSS:  Please see Dr. Bosie Helper note for this.   INDICATIONS FOR PROCEDURE:  The patient is a 77 year old male undergoing an  abdominal aortic aneurysm repair today.  After examining his abdomen by the  vascular surgeon Dr. Arbie Cookey, the gallbladder was found to be inflamed with  adhesions to it, distended, and erythematous.  I was asked to consult  interoperatively, saw the gallbladder, and felt this was consistent with  acute cholecystitis, either calculous or acalculous.  Given the appearance  of the gallbladder, I did not feel comfortable recommending leaving this  behind and I recommended removal.  Dr. Arbie Cookey felt this would be appropriate,  as well, and felt his graft would be retroperitoneal once this was done.   DESCRIPTION OF PROCEDURE:  I scrubbed in during the case once the aortic  aneurysm repair was complete.  He had over covered the graft and peritoneum.  I was then able to examine gallbladder with my hands.  It was very  distended, inflamed appearing, and not healthy.  I could be sure if I could  palpate stones but did not want to aspirate this with a fresh aortic graft  and was concerned about getting into the gallbladder so I went ahead and  began to remove it.  I began to take the gallbladder in a dome down fashion  using electrocautery.  We did this all the  way until we got toward the  infundibulum.  I then identified the cystic artery and was able to dissect  this off the wall of the gallbladder, double clipped this and divided.  I  was then able to mobilize the gallbladder further, identify the cystic duct  and mobilize the gallbladder all way down until it was hanging on only by  the cystic duct.  This was the only tubular structure of note.  There is no  evidence of any common duct dilatation that I could see.  The cystic duct  was then double clipped with two large clips and divided.  No bile was  spilled.  It was passed off the field.  I  inspected the gallbladder fossa and found it to be hemostatic with clips on  the cystic duct and cystic artery with no signs of bleeding.  At this point  in time, Dr. Arbie Cookey finished the case and did the closure.  See operative  notes for this from him.      Thomas A. Cornett, M.D.  Electronically Signed  TAC/MEDQ  D:  05/03/2006  T:  05/03/2006  Job:  [id number]   cc:   Larina Earthly, M.D.  [address]  [place]  [place] [address]

## 2011-04-14 NOTE — Consult Note (Signed)
NAME:  Shane Simpson NO.:  [id number]   MEDICAL RECORD NO.:  [id number]          PATIENT TYPE:  INP   LOCATION:  3739                         FACILITY:  [facility]   PHYSICIAN:  [name], M.D.  DATE OF BIRTH:  10/14/1920   DATE OF CONSULTATION:  05/02/2006  DATE OF DISCHARGE:                                   CONSULTATION   Thank you very much for asking [place] to see [name] in consultation  for recurrent syncope.   [name] is an 78 year old gentleman with no known heart disease who was  admitted five days ago for a spell of syncope.  This episode occurred while  he was walking up the driveway having paid the man for black topping his  driveway, turning around, still feeling well, getting halfway back and then  feeling like his legs became very, very heavy.  He was able to get back to  the house where he sat down at the table for lunch and then had this wave  come up from his feet to his head where he felt like he was going to pass  out and, in fact, did.  911 was called and he was aroused and communicative  by the time they arrived.  He was brought to the emergency room at [facility] where evaluation included a D-dimer that was over 19.  He underwent  CT scanning which demonstrated no pulmonary embolism, but a 10 cm abdominal  aortic aneurysm.  Cardiac pre screening included a Myoview that was negative  with ejection fraction of 65-70%.  Carotids were done without stenosis.  Neurological examination was also undertaken for his syncope which was  unrevealing.  This morning while sitting in a chair he got up to go to the  sink.  As he was looking in the shower he had the same sensation of warmth  coming from his feet to his head.  The nurses rushed in and caught him as he  was about to fall.  Telemetry demonstrated progressive sinus node  dysfunction ultimately ending in sinus arrest with nearly a six-second  pause.  There was then recovery of  his sinus rate.   He does not have a history of previous syncope.  He has had some episodes of  lightheadedness.   PAST MEDICAL HISTORY:  1.  Notable for cataracts for which he takes Timoptic eye drops.  2.  Hypertension.  3.  Prostatic hypertrophy.  4.  Basal cell carcinoma.  5.  Bell's palsy 20 years ago.   PAST SURGICAL HISTORY:  Notable for inguinal herniorrhaphy.   SOCIAL HISTORY:  He is married.  He used to own a grocery store.  He has  children who are actively involved in his care.   CURRENT MEDICATIONS:  In addition to the Timoptic and Cardura include  metoprolol which was discontinued this morning.   REVIEW OF SYSTEMS:  Noncontributory apart from what is listed in the chart.   PHYSICAL EXAMINATION:  VITAL SIGNS:  Blood pressure 121/71, pulse 66.  GENERAL:  He is an elderly Caucasian man appearing  his stated age.  HEENT:  No icterus.  No xanthomata.  Neck veins were flat.  Carotids were  brisk and full bilaterally without bruits.  BACK:  Without kyphosis, scoliosis.  LUNGS:  Clear.  HEART:  Sounds regular without murmurs or gallops.  ABDOMEN:  Soft with active bowel sounds.  His belly was not palpated.  EXTREMITIES:  Femoral pulses were 2+.  Distal pulses were intact.  There was  no clubbing, cyanosis, edema.  There were contractures on his left fourth  and fifth fingers.   His telemetry was notable for the sinus slowing and sinus pauses noted  previously.   Electrocardiogram at baseline demonstrated sinus rhythm at 60 with intervals  of 0.18/0.14/0.43 with right bundle branch block and early R-wave  progression.   IMPRESSION:  1.  Sinus node pauses with greater than a six-second arrest, but associated      with PP prolongation.  2.  Recurrent syncope/presyncope at least __________ of which was associated      with #1.  3.  Abdominal aortic aneurysm 10 cm, awaiting surgery.  4.  Cardiac evaluation with a negative Myoview and normal left ventricular       function.  5.  Recurrent lightheadedness with upward gaze with a negative carotid sinus      massage.  6.  Hypertension.  7.  Right bundle branch block.  8.  Elevated D-dimer with a negative CT for pulmonary embolism.   DISCUSSION:  Mr. Dockter has recurrent syncope/presyncope associated with  documented sinus node dysfunction and pauses greater than six seconds.  What  is interesting is that there is PP prolongation preceding the sinus arrest.  This suggests that there is an external factor contributing to the sinus  node slowing potentially.  However, on extensive history I cannot identify a  potential trigger here.  That having been said, given the recurrence of the  episodes I think that proceeding with pacing is reasonable.  Alternatively,  we could wait and see how he did following his AAA repair, although I cannot  think of a mechanism by which the AAA issue would be contributing to a  neurally mediated bradycardia.   I further do not think that the beta blockers are contributing to his  symptoms and that post or peri implant beta blockers would be useful.   Based on the above, therefore, I have reviewed with the family the potential  benefits of pacing as well as the potential drawbacks that, in fact, may be  a concomitant vasodepressor mechanism which may be contributing at which the  pacemaker will not ameliorate and that symptoms could recur.  They  understand this issue and we are  choosing to proceed anyway.  I have reviewed with them also the potential  benefits as well as the potential risks including, but not limited to,  death, perforation, infection, pneumothorax, and lead dislodgement.  They  understand these risks and are willing to proceed.           ______________________________  [name], M.D.     SCK/MEDQ  D:  05/02/2006  T:  05/03/2006  Job:  [id number]   cc:   Larina Earthly, M.D.  [address]  [place] [address]   EP Lab   [facility]  [facility]   Lindaann Pascal, M.D.  [facility]   Ricki Rodriguez, M.D.  Fax: [phone]

## 2011-04-14 NOTE — Op Note (Signed)
NAME:  Shane Simpson, Shane Simpson NO.:  [id number]   MEDICAL RECORD NO.:  [id number]          PATIENT TYPE:  INP   LOCATION:  3739                         FACILITY:  [facility]   PHYSICIAN:  [name], M.D.  DATE OF BIRTH:  1920/04/06   DATE OF PROCEDURE:  05/02/2006  DATE OF DISCHARGE:                                 OPERATIVE REPORT   PREOPERATIVE DIAGNOSES:  Sinus node dysfunction with syncope and greater  than six-second pauses.   POSTOPERATIVE DIAGNOSES:  Sinus node dysfunction with syncope and greater  than six-second pauses.   Following obtained informed consent the patient was then brought to the  electrophysiology laboratory and placed on the fluoroscopic table in the  supine table.  After routine prep and drape of the left upper chest  lidocaine was infiltrated in the prepectoral subclavicular region and  incision was made and carried down under the layer of the prepectoral fascia  using electrocautery and sharp dissection.  A pocket was formed similarly.  Hemostasis was obtained.   Thereafter attention was turned to gain access to the extrathoracic or  subclavian vein which was accomplished without difficulty without the  aspiration of air or puncture of the artery.  Two separate venipunctures  were accomplished.  Guidewires were placed and retained.  Thereafter, a 7-  [place] tearaway introducer sheath were placed through which were passed  sequentially Medtronic 5076 52 cm active fixation ventricular lead serial  #[id number] and a Medtronic 5076 45 cm active fixation atrial lead serial  #[id number].  The ventricular lead was marked with a tie.  Under  fluoroscopic guidance these leads were manipulated in the right ventricular  apex and the right atrial free wall, the ventricular lead having been  attempted to be placed on the septum, but unsuccessfully.  The apical R-wave  was 11.6 with impedance of 1024 ohms, a threshold of 0.9 volts at 0.5  milliseconds.   Current threshold is 1.1 MA and there was no diaphragmatic  pacing at 10 volts.   The bipolar P-wave was 2.8 millivolts with a pacing impedance of 1.2 volts.  Current threshold is 1.8 MA and the impedance was 905 ohms.  Again, there  was no diaphragmatic pacing at 10 volts.   With these acceptable parameters recorded the leads were then attached to a  Medtronic Versa Vedro1 pulse generator serial [id number].  Atrial pacing  was identified.  The pocket was copiously irrigated with antibiotic-  containing saline solution.  Hemostasis was assured and the leads and the  pulse generator were placed in the pocket, secured to the prepectoral  fascia.  Because the patient is anticipated to be exposed to high-dose  heparin tomorrow at the time of his AAA we ended up putting in Surgicel into  the wound to try and make sure it was as dry as possible before heparin  exposure tomorrow.  The pocket was then closed in three layers in the normal  fashion.  The wound  was washed, dried, and a Benzoin Steri-Strips dressing was applied.  Needle  counts, sponge counts, and instrument counts were correct at the end  of the  procedure according to the staff.  The patient tolerated the procedure  without apparent complications.           ______________________________  [name], M.D.     SCK/MEDQ  D:  05/02/2006  T:  05/03/2006  Job:  [id number]   cc:   Larina Earthly, M.D.  [address]  [place]  [place] [address]   Ricki Rodriguez, M.D.  Fax: [phone]   EP Lab   [facility]   Lindaann Pascal, M.D.  [facility]

## 2011-04-14 NOTE — Consult Note (Signed)
NAME:  JUSITN, SALSGIVER NO.:  [id number]   MEDICAL RECORD NO.:  [id number]          PATIENT TYPE:  INP   LOCATION:  2399                         FACILITY:  [facility]   PHYSICIAN:  Clovis Pu. Cornett, M.D.DATE OF BIRTH:  1920-11-01   DATE OF CONSULTATION:  05/03/2006  DATE OF DISCHARGE:                                   CONSULTATION   REFERRING PHYSICIAN:  Larina Earthly, M.D.   REASON FOR CONSULTATION:  Cholecystitis.   HISTORY OF PRESENT ILLNESS:  The patient is an 77 year old male who was  undergoing abdominal aortic aneurysm repair today by Dr. Arbie Cookey.  During the  procedure he found a very large, distended, inflamed-appearing gallbladder  and I was asked to come in as an intraoperative consult to look at this.  According to his chart there is no history as far as we can tell of him  having cholecystitis or gallstones.  CT scan for his aneurysm did not reveal  any stones.  Given the gross appearance though, his gallbladder upon  examination of this, it appeared distended, inflamed, red, had signs of  cholecystitis, possibly a calculus if no stones are present.  Dr. Arbie Cookey  could not feel any stones, but I was asked to consult on this issue.  Given  the appearance of his gallbladder, I discussed this with Dr. Arbie Cookey.  We both  felt it should come out.  He did not feel any potential harm to his graft by  doing this and I am afraid by leaving this gallbladder in the patient, this  will be a source of infection postoperatively, especially with a new aortic  graft.  We will go ahead and proceed with this part of the procedure given  the appearance of his gallbladder.      Thomas A. Cornett, M.D.  Electronically Signed     TAC/MEDQ  D:  05/03/2006  T:  05/03/2006  Job:  [id number]

## 2011-04-14 NOTE — Assessment & Plan Note (Signed)
[facility]                           ELECTROPHYSIOLOGY OFFICE NOTE   NAME:[name], [name]                     MRN:          [id number]  DATE:08/07/2006                            DOB:          26-Jun-1920    Shane Simpson is seen following pacemaker implantation for syncope associated  with sinus node pauses. He tells me today a story that I had not appreciated  previously and that was that he has been having problems with  lightheadedness over the years that is provoked by looking up.  It resolves  with sitting and turning his head down and over about 10 to 15 minutes, the  symptoms abate.  He does not wear collared shirts.  He does not turn his  head to drive. His wife does that for him and when he shaves, he does not  raise his head because he has some neck problems.   MEDICATIONS:  His medications are minimal taking Inderal, Timoptic and  Cardura.   PHYSICAL EXAMINATION:  VITAL SIGNS: Blood pressure 128/64, pulse 76.  LUNGS:  Clear.  HEART:  Heart sounds were regular.  The patient pacemaker pocket was well  healed.  EXTREMITIES:  Without edema.   Interrogation of his Medtronic Versa pulse generator demonstrates a P wave  of 4 with impedance of 566, threshold of 0.5 at 0.4.  The R wave is 8 with  impedance of 669, threshold of 0.75 at 0.4.  Battery voltage is 2.79.   IMPRESSION:  1. Sinus node dysfunction.  2. Syncope with #1.  3. Status post pacer for the above.  4. Question carotid sinus hypersensitivity.   Shane Simpson is doing quite well.  I have explained to him the physiology of  carotid sinus hypersensitivity.  Will plan to see him again in nine months.                                   [name], MD, [facility]   SCK/MedQ  DD:  08/07/2006  DT:  08/08/2006  Job #:  [id number]   cc:   [place]

## 2011-04-14 NOTE — Discharge Summary (Signed)
NAMESAABIR, Shane Simpson NO.:  [id number]   MEDICAL RECORD NO.:  [id number]          PATIENT TYPE:  INP   LOCATION:  2034                         FACILITY:  [facility]   PHYSICIAN:  Larina Earthly, M.D.    DATE OF BIRTH:  June 19, 1920   DATE OF ADMISSION:  04/28/2006  DATE OF DISCHARGE:                                 DISCHARGE SUMMARY   PRIMARY DIAGNOSIS:  1.  Large abdominal aortic aneurysm.  2.  Acute cholecystitis.   IN-HOSPITAL DIAGNOSES:  Sinus node function with syncope and greater than  six-second pulses.   SECONDARY DIAGNOSES:  1.  History of cataracts.  2.  Hypertension.  3.  Prostatic hypertrophy.  4.  Basal cell carcinoma.  5.  Bell's palsy 20 years ago.  6.  Inguinal herniorrhaphy.   ALLERGIES:  No known drug allergies.   IN-HOSPITAL OPERATIONS AND PROCEDURES:  1.  Resection and grafting of abdominal aortic aneurysm repair with 16 x 8      Hemashield graft from aorta to bilateral common iliac arteries.  2.  Open cholecystectomy.  3.  Dual chamber permanent pacemaker implant.   HISTORY AND PHYSICAL AND HOSPITAL COURSE:  [name] is an 77 year old male  who presented to the emergency room with complaints of a syncopal episode.  Patient underwent CT scan to rule out a pulmonary embolism.  There was an  incidental finding of a 10 cm infrarenal abdominal aortic aneurysm.  After  further discussion patient had complained of abdominal pain x6 months.  At  presentation to the emergency room patient denied any abdominal pain.  Following CT scan Dr. Arbie Cookey was consulted.  Patient seen and evaluated by  Dr. Arbie Cookey on April 28, 2006.  At that time patient was stable from a vascular  standpoint.  Plan was to perform elective repair of abdominal aortic  aneurysm after work-up of syncope and preoperative cardiac evaluation as  well as carotid was done.   Patient was admitted under Dr. Darrick Huntsman.  A 2-D echocardiogram was done on April 29, 2006 which showed left  ventricular ejection fraction was estimated to be  70%.  There is no left ventricular regional wall motion abnormalities.  Overall normal 2-D echocardiogram.  A Cardiolite Myoview study was done on  April 30, 2006.  This came back showing no ischemia on the nuclear stress  test.  Patient also had cardiac enzymes obtained which were negative.  Carotid duplex ultrasound was done on May 01, 2006 which showed no  significant ICA stenosis bilaterally.  On May 02, 2006 Dr. Graciela Husbands was  consulted to evaluate patient for his syncopal episode.  At that time Dr.  Graciela Husbands noted sinuses pauses with greater than six-second arrest associated  with PT prolongation.  At that time he recommended placing a permanent  pacemaker.  After further discussion with patient and family they agreed.  Dr. Graciela Husbands placed a dual chamber permanent pacemaker on May 02, 2006.  The  patient tolerated this procedure without any complications.  Patient  remained stable during this time from a vascular standpoint.  After  permanent pacemaker placed and  Myoview seen to be negative and carotids seen  to be clear Dr. Arbie Cookey discussed with patient and s family undergoing  elective abdominal aortic aneurysm repair.  He discussed risks and benefits  with patient.  Patient acknowledged understanding and agreed to proceed.  Surgery was scheduled for May 03, 2006.   Patient was taken to the operating room May 03, 2006 where he underwent a  resection and grafting of abdominal aortic aneurysm using a repair with a 16  x 8 Hemashield graft from the aorta to bilateral common iliac arteries.  During the surgery Dr. Arbie Cookey had examined the gallbladder and was found to  be inflamed with adhesions to it, distended, and erythematous.  At that time  Dr. Arbie Cookey consulted Dr. Luisa Hart.  Dr. Luisa Hart came intraoperatively to  evaluate patient.  At that time Dr. Luisa Hart was assisting Dr. Arbie Cookey, did an  open cholecystectomy.  Patient tolerated these procedures  well and was  transferred up to the intensive care unit in stable condition.  Following  surgery patient was seen to be hemodynamically stable.  He was extubated  later that evening following surgery.  Following extubation patient was seen  to be alert and oriented x3.  Patient's postoperative course was pretty much  unremarkable.  NG tube was discontinued postoperative day one.  He was out  of bed to chair postoperative day one.  Bilateral feet had 2+ pulses noted.  Pain was well controlled.  He was out of bed ambulating well postoperative  day two.  Patient did develop acute blood loss anemia postoperative day two  with a hemoglobin of 7.4.  He did receive 1 unit of packed red blood cells  at that time.  Hemoglobin did increase appropriately to postoperative day  three to 8.6.  This was monitored during the remainder of his hospital  course and seen to be stable.  Last hemoglobin and hematocrit obtained was  postoperative day six and seen to be 10 and 29.  Patient was kept n.p.o.  until postoperative day four.  Diet was started slowly with clear liquids.  Patient tolerated this well and was advanced to full liquids on  postoperative day five.  Bowels were functioning by postoperative day with  small bowel movement.  Again, patient tolerated this well and was advanced  to a regular diet postoperative day six.  No nausea, vomiting noted with  advancement of diet.  Patient was out of bed ambulating well.  Incisions  were clean, dry, and intact.  Patient did complain of diarrhea postoperative  day six and C. difficile was sent off.  Currently, this is still pending.  Imodium was ordered p.r.n.  Patient was seen to be afebrile during his  postoperative course.  Vital signs were monitored and seen to be stable.  He  was able to be weaned off oxygen saturating greater than 90% on room air.  Dr. Graciela Husbands continued to follow during patient's postoperative course. Pacemaker was working appropriately.   General surgery as well continued to  follow.   Pending C. difficile results and his diarrhea improved so patient is  tentatively ready for discharge home postoperative day seven, May 10, 2006.  Follow-up appointment has been arranged with Dr. Arbie Cookey for May 28, 2006 at  10:50 a.m.  Patient is to contact Dr. Odessa Fleming office as well as Dr.  Roseanne Kaufman office to schedule follow-up appointments with them in the next  several weeks.  Mr. Stegmann received instructions on diet, activity level,  incisional care.  He was told no driving until released to do so, no heavy  lifting over 10 pounds.  Patient is told he is allowed to shower washing his  incisions using soap and water.  He is to contact the office if he develops  any drainage or opening from any of his incision sites.  His staple removal  appointment will be scheduled in the office for in two weeks.  Mr. Maynez  was told to ambulate three to four times per day, progress as tolerated.  He  was told to continue his breathing exercises.   DISCHARGE MEDICATIONS:  1.  Oxycodone 5 mg one to two tablets q.4-6h. p.r.n. pain.  2.  Timoptic one drop both eyes daily.  3.  Cardura 4 mg at night.  4.  Fish oil 1200 mg daily.  5.  Calcium with vitamin D 500 mg b.i.d.  6.  Vitamin C 500 mg b.i.d.  7.  Iron 325 mg daily.  8.  Metamucil as used at home.  9.  Colace as used at home.   Patient will also need to follow up with Dr. Luisa Hart in next several weeks.  He will need to call to schedule this appointment.      Theda Belfast, [place]      Larina Earthly, M.D.  Electronically Signed    KMD/MEDQ  D:  05/09/2006  T:  05/09/2006  Job:  [id number]   cc:   [name], M.D.  [address]  [address]  [place]  [place] [address]   Ricki Rodriguez, M.D.  Fax: [phone]   Clovis Pu. Cornett, M.D.  [address]  [place] [address]

## 2011-05-25 ENCOUNTER — Encounter: Payer: Medicare Other | Admitting: *Deleted

## 2011-05-30 ENCOUNTER — Encounter: Payer: Self-pay | Admitting: *Deleted

## 2011-06-26 ENCOUNTER — Telehealth: Payer: Self-pay | Admitting: Internal Medicine

## 2011-06-26 NOTE — Telephone Encounter (Signed)
Pt need the 1-800 phone number again.

## 2011-07-13 ENCOUNTER — Ambulatory Visit (INDEPENDENT_AMBULATORY_CARE_PROVIDER_SITE_OTHER): Payer: Medicare Other | Admitting: *Deleted

## 2011-07-13 DIAGNOSIS — Z950 Presence of cardiac pacemaker: Secondary | ICD-10-CM

## 2011-07-13 DIAGNOSIS — I495 Sick sinus syndrome: Secondary | ICD-10-CM

## 2011-07-14 ENCOUNTER — Telehealth: Payer: Self-pay | Admitting: Internal Medicine

## 2011-07-14 ENCOUNTER — Other Ambulatory Visit: Payer: Self-pay | Admitting: Internal Medicine

## 2011-07-14 ENCOUNTER — Encounter: Payer: Self-pay | Admitting: Internal Medicine

## 2011-07-14 LAB — REMOTE PACEMAKER DEVICE
AL IMPEDENCE PM: 582 Ohm
AL THRESHOLD: 0.75 V
ATRIAL PACING PM: 80
BATTERY VOLTAGE: 2.77 V
VENTRICULAR PACING PM: 0

## 2011-07-14 NOTE — Telephone Encounter (Signed)
Patient to send transmission today for missed appt. On 05/25/11.

## 2011-07-14 NOTE — Telephone Encounter (Signed)
Pt has new device to check his pacemaker with and needs to know when he should do a remote check. Please call him back with this info.

## 2011-07-26 ENCOUNTER — Encounter: Payer: Self-pay | Admitting: *Deleted

## 2011-07-26 NOTE — Progress Notes (Signed)
Pacer remote check  

## 2011-10-12 ENCOUNTER — Other Ambulatory Visit: Payer: Self-pay | Admitting: Internal Medicine

## 2011-10-12 ENCOUNTER — Ambulatory Visit (INDEPENDENT_AMBULATORY_CARE_PROVIDER_SITE_OTHER): Payer: Medicare Other | Admitting: *Deleted

## 2011-10-12 ENCOUNTER — Encounter: Payer: Self-pay | Admitting: Internal Medicine

## 2011-10-12 DIAGNOSIS — Z950 Presence of cardiac pacemaker: Secondary | ICD-10-CM

## 2011-10-12 DIAGNOSIS — I495 Sick sinus syndrome: Secondary | ICD-10-CM

## 2011-10-13 LAB — REMOTE PACEMAKER DEVICE
ATRIAL PACING PM: 80
BAMS-0001: 175 {beats}/min
RV LEAD IMPEDENCE PM: 631 Ohm
RV LEAD THRESHOLD: 0.625 V
VENTRICULAR PACING PM: 0

## 2011-10-18 NOTE — Progress Notes (Signed)
Remote pacer check  

## 2011-11-02 ENCOUNTER — Encounter: Payer: Self-pay | Admitting: *Deleted

## 2012-03-20 ENCOUNTER — Ambulatory Visit (INDEPENDENT_AMBULATORY_CARE_PROVIDER_SITE_OTHER): Payer: Medicare Other | Admitting: Internal Medicine

## 2012-03-20 ENCOUNTER — Encounter: Payer: Self-pay | Admitting: Internal Medicine

## 2012-03-20 VITALS — BP 147/74 | HR 73 | Ht 67.0 in | Wt 161.8 lb

## 2012-03-20 DIAGNOSIS — R0789 Other chest pain: Secondary | ICD-10-CM

## 2012-03-20 DIAGNOSIS — R0609 Other forms of dyspnea: Secondary | ICD-10-CM | POA: Insufficient documentation

## 2012-03-20 DIAGNOSIS — I495 Sick sinus syndrome: Secondary | ICD-10-CM

## 2012-03-20 DIAGNOSIS — Z950 Presence of cardiac pacemaker: Secondary | ICD-10-CM

## 2012-03-20 LAB — PACEMAKER DEVICE OBSERVATION
AL IMPEDENCE PM: 582 Ohm
AL THRESHOLD: 0.5 V
ATRIAL PACING PM: 81.6
RV LEAD AMPLITUDE: 11.2 mv
RV LEAD THRESHOLD: 0.75 V

## 2012-03-20 NOTE — Progress Notes (Signed)
  HPI  Shane Simpson is a 78 y.o. male for sinus node dysfunction associated with syncope. He is status post pacemaker implantation   He has complaints of recent worsening of exercise tolerance manifested by shortness of breath and accompanied by chest tightness. He saw Dr. Arnetha Courser about 3 weeks ago; he does not recall whether he discussed this with him. He had a exercise test in his office at the turn of the year which he was told went well.  Review of the old chart demonstrates a Myoview scan done at the hospital in 2007 which was normal      Past Medical History  Diagnosis Date  . HYPERLIPIDEMIA 07/09/2009  . BELLS PALSY 07/09/2009  . GLUCOMA 07/09/2009  . Essential hypertension, benign 07/09/2009  . HYPERTENSION 08/06/2009  . INGUINAL HERNIA, RIGHT 08/06/2009  . CONSTIPATION 08/06/2009  . URINARY INCONTINENCE 08/06/2009  . PSA, INCREASED 07/09/2009  . SKIN CANCER, HX OF 08/06/2009  . BENIGN PROSTATIC HYPERTROPHY, HX OF 07/09/2009  . PACEMAKER, PERMANENT 02/22/2010    Medtronic DDD  . Sinus node dysfunction     Past Surgical History  Procedure Date  . Pacemaker placement 04/2006  . Abdominal aortic aneurysm repair 04/2006  . Cholecystectomy 04/2006  . Hernia repair 1997    Left inguinal  . Prostate surgery 2009    Cancer    Current Outpatient Prescriptions  Medication Sig Dispense Refill  . doxazosin (CARDURA) 8 MG tablet Take 8 mg by mouth. 1/2 tab daily       . ferrous gluconate (FERGON) 240 (27 FE) MG tablet Take 240 mg by mouth daily.        . hydrochlorothiazide 25 MG tablet Take 25 mg by mouth daily.        . metoprolol (LOPRESSOR) 50 MG tablet Take 50 mg by mouth 2 (two) times daily.        . Omega-3 Fatty Acids (FISH OIL) 1200 MG CAPS Take 1,200 mg by mouth 2 (two) times daily.       . timolol (TIMOPTIC-XE) 0.25 % ophthalmic gel-forming Place 1 drop into both eyes daily.          No Known Allergies  Review of Systems negative except from HPI and PMH  Physical  Exam BP 147/74  Pulse 73  Ht 5\' 7"  (1.702 m)  Wt 161 lb 12.8 oz (73.392 kg)  BMI 25.34 kg/m2 Well developed and well nourished in no acute distress HENT normal E scleral and icterus clear Neck Supple JVP flat; carotids brisk and full Clear to ausculation Regular rate and rhythm, no murmurs gallops or rub Soft with active bowel sounds No clubbing cyanosis none Edema Alert and oriented, grossly normal motor and sensory function Skin Warm and Dry   Assessment and  Plan

## 2012-03-20 NOTE — Assessment & Plan Note (Signed)
As noted rate response reprogrammed with some improvement.

## 2012-03-20 NOTE — Assessment & Plan Note (Signed)
Some of his dyspnea was improved with reprogramming of his rate response. Other parameters were normal

## 2012-03-20 NOTE — Assessment & Plan Note (Signed)
The patient is having chest tightness. I urged him to followup with his primary cardiologist. His electrocardiogram not acute.

## 2012-03-20 NOTE — Patient Instructions (Signed)
Remote monitoring is used to monitor your Pacemaker of ICD from home. This monitoring reduces the number of office visits required to check your device to one time per year. It allows [place] to keep an eye on the functioning of your device to ensure it is working properly. You are scheduled for a device check from home on June 27, 2012. You may send your transmission at any time that day. If you have a wireless device, the transmission will be sent automatically. After your physician reviews your transmission, you will receive a postcard with your next transmission date.  Your physician wants you to follow-up in: 1 year with Dr Logan Bores will receive a reminder letter in the mail two months in advance. If you don't receive a letter, please call our office to schedule the follow-up appointment.

## 2012-06-27 ENCOUNTER — Ambulatory Visit (INDEPENDENT_AMBULATORY_CARE_PROVIDER_SITE_OTHER): Payer: Medicare Other | Admitting: *Deleted

## 2012-06-27 ENCOUNTER — Encounter: Payer: Self-pay | Admitting: Internal Medicine

## 2012-06-27 DIAGNOSIS — I495 Sick sinus syndrome: Secondary | ICD-10-CM

## 2012-06-27 DIAGNOSIS — Z950 Presence of cardiac pacemaker: Secondary | ICD-10-CM

## 2012-07-02 LAB — REMOTE PACEMAKER DEVICE
AL IMPEDENCE PM: 556 Ohm
ATRIAL PACING PM: 88
RV LEAD AMPLITUDE: 16 mv
RV LEAD IMPEDENCE PM: 617 Ohm
RV LEAD THRESHOLD: 0.625 V

## 2012-07-24 ENCOUNTER — Encounter: Payer: Self-pay | Admitting: *Deleted

## 2012-09-30 ENCOUNTER — Ambulatory Visit (INDEPENDENT_AMBULATORY_CARE_PROVIDER_SITE_OTHER): Payer: Medicare Other | Admitting: *Deleted

## 2012-09-30 ENCOUNTER — Encounter: Payer: Self-pay | Admitting: Internal Medicine

## 2012-09-30 DIAGNOSIS — I495 Sick sinus syndrome: Secondary | ICD-10-CM

## 2012-09-30 DIAGNOSIS — Z950 Presence of cardiac pacemaker: Secondary | ICD-10-CM

## 2012-10-08 LAB — REMOTE PACEMAKER DEVICE
AL IMPEDENCE PM: 564 Ohm
AL THRESHOLD: 0.75 V
RV LEAD AMPLITUDE: 16 mv
RV LEAD IMPEDENCE PM: 613 Ohm
RV LEAD THRESHOLD: 0.625 V

## 2012-10-25 ENCOUNTER — Ambulatory Visit (INDEPENDENT_AMBULATORY_CARE_PROVIDER_SITE_OTHER): Payer: Medicare Other | Admitting: Family Medicine

## 2012-10-25 ENCOUNTER — Encounter: Payer: Self-pay | Admitting: Family Medicine

## 2012-10-25 VITALS — BP 130/60 | HR 74 | Temp 97.4°F | Wt 162.0 lb

## 2012-10-25 DIAGNOSIS — H3530 Unspecified macular degeneration: Secondary | ICD-10-CM | POA: Insufficient documentation

## 2012-10-25 DIAGNOSIS — R638 Other symptoms and signs concerning food and fluid intake: Secondary | ICD-10-CM

## 2012-10-25 DIAGNOSIS — Z87898 Personal history of other specified conditions: Secondary | ICD-10-CM

## 2012-10-25 DIAGNOSIS — I10 Essential (primary) hypertension: Secondary | ICD-10-CM

## 2012-10-25 LAB — BASIC METABOLIC PANEL
BUN: 37 mg/dL — ABNORMAL HIGH (ref 6–23)
Calcium: 9.4 mg/dL (ref 8.4–10.5)
GFR: 43.04 mL/min — ABNORMAL LOW (ref >60.00)
Glucose, Bld: 114 mg/dL — ABNORMAL HIGH (ref 70–99)

## 2012-10-25 NOTE — Progress Notes (Signed)
  Subjective:    Patient ID: Shane Simpson, male    DOB: 01/08/1920, 78 y.o.   MRN: [id number]  HPI  Patient seen for medical followup. He has history of multiple skin cancers, hypertension, macular degeneration, BPH, history of Bell's palsy with residual symptoms, and mild hyperlipidemia. His medications reviewed. He takes HCTZ and Lopressor for hypertension. Electrolytes apparently in quite some time. He's had some recent concern for possible diabetes. He bases this on some increased thirst and tingling sensation in both feet. He's noticed some sores have been slow to heal. No weight loss. No appetite changes. No chest pains.  He is followed closely by ophthalmology regarding his macular degeneration. No recent falls.   Review of Systems  Constitutional: Negative for appetite change, fatigue and unexpected weight change.  HENT: Negative for trouble swallowing.   Eyes: Negative for visual disturbance.  Respiratory: Negative for cough, chest tightness and shortness of breath.   Cardiovascular: Negative for chest pain, palpitations and leg swelling.  Gastrointestinal: Negative for abdominal pain.  Genitourinary: Negative for dysuria and difficulty urinating.  Neurological: Negative for dizziness, syncope, weakness, light-headedness and headaches.  Psychiatric/Behavioral: Negative for dysphoric mood.       Objective:   Physical Exam  Constitutional: He is oriented to person, place, and time. He appears well-developed and well-nourished.  Neck: Neck supple.  Cardiovascular: Normal rate and regular rhythm.   Pulmonary/Chest: Effort normal and breath sounds normal. No respiratory distress. He has no wheezes. He has no rales.  Musculoskeletal: He exhibits no edema.  Neurological: He is alert and oriented to person, place, and time.          Assessment & Plan:  #1 hypertension. Stable. Check basic metabolic panel with HCTZ therapy #2 symptoms of thirst and an intermittent urine  frequency. Check blood sugar. He is nonfasting today. If this is elevated may need to return for fasting blood sugar #3 history of BPH. Symptomatically stable. He remains on low-dose doxazosin has not had any orthostasis.

## 2012-11-04 ENCOUNTER — Encounter: Payer: Self-pay | Admitting: *Deleted

## 2013-01-06 ENCOUNTER — Ambulatory Visit (INDEPENDENT_AMBULATORY_CARE_PROVIDER_SITE_OTHER): Payer: Medicare Other | Admitting: *Deleted

## 2013-01-06 ENCOUNTER — Other Ambulatory Visit: Payer: Self-pay

## 2013-01-06 DIAGNOSIS — I495 Sick sinus syndrome: Secondary | ICD-10-CM

## 2013-01-06 DIAGNOSIS — Z950 Presence of cardiac pacemaker: Secondary | ICD-10-CM

## 2013-01-09 LAB — REMOTE PACEMAKER DEVICE
AL IMPEDENCE PM: 556 Ohm
AL THRESHOLD: 0.75 V
RV LEAD IMPEDENCE PM: 673 Ohm
RV LEAD THRESHOLD: 0.625 V

## 2013-01-21 ENCOUNTER — Encounter: Payer: Self-pay | Admitting: *Deleted

## 2013-01-28 ENCOUNTER — Encounter: Payer: Self-pay | Admitting: Internal Medicine

## 2013-03-26 ENCOUNTER — Ambulatory Visit (INDEPENDENT_AMBULATORY_CARE_PROVIDER_SITE_OTHER): Payer: Medicare Other | Admitting: Internal Medicine

## 2013-03-26 ENCOUNTER — Encounter: Payer: Self-pay | Admitting: Internal Medicine

## 2013-03-26 VITALS — BP 136/51 | HR 74 | Ht 67.0 in | Wt 163.4 lb

## 2013-03-26 DIAGNOSIS — Z950 Presence of cardiac pacemaker: Secondary | ICD-10-CM

## 2013-03-26 DIAGNOSIS — I495 Sick sinus syndrome: Secondary | ICD-10-CM

## 2013-03-26 LAB — PACEMAKER DEVICE OBSERVATION
AL AMPLITUDE: 5.6 mv
AL THRESHOLD: 0.5 V
BAMS-0001: 175 {beats}/min
RV LEAD IMPEDENCE PM: 624 Ohm
RV LEAD THRESHOLD: 0.5 V

## 2013-03-26 NOTE — Assessment & Plan Note (Signed)
The patient's device was interrogated.  The information was reviewed. Changes were made as noted above 

## 2013-03-26 NOTE — Patient Instructions (Signed)
Remote monitoring is used to monitor your Pacemaker of ICD from home. This monitoring reduces the number of office visits required to check your device to one time per year. It allows [place] to keep an eye on the functioning of your device to ensure it is working properly. You are scheduled for a device check from home on 06/30/13. You may send your transmission at any time that day. If you have a wireless device, the transmission will be sent automatically. After your physician reviews your transmission, you will receive a postcard with your next transmission date.  Your physician wants you to follow-up in: 1 year with Dr. Graciela Husbands. You will receive a reminder letter in the mail two months in advance. If you don't receive a letter, please call our office to schedule the follow-up appointment.  Your physician recommends that you continue on your current medications as directed. Please refer to the Current Medication list given to you today.

## 2013-03-26 NOTE — Assessment & Plan Note (Signed)
He has dyspnea on exertion which may be a manifestation of chronotropic incompetence. We will reprogram his device to a lower rate limit of 70 and increases threshold medium-low->>>-low.

## 2013-03-26 NOTE — Progress Notes (Signed)
Patient Care Team: Kristian Covey, MD as PCP - General   HPI  Shane Simpson is a 79 y.o. male  Seen for sinus node dysfunction associated with syncope. He is status post pacemaker implantation    Review of the old chart demonstrates a Myoview scan done at the hospital in 2007 which was normal  Past Medical History  Diagnosis Date  . HYPERLIPIDEMIA 07/09/2009  . BELLS PALSY 07/09/2009  . GLUCOMA 07/09/2009  . Essential hypertension, benign 07/09/2009  . HYPERTENSION 08/06/2009  . INGUINAL HERNIA, RIGHT 08/06/2009  . CONSTIPATION 08/06/2009  . URINARY INCONTINENCE 08/06/2009  . PSA, INCREASED 07/09/2009  . SKIN CANCER, HX OF 08/06/2009  . BENIGN PROSTATIC HYPERTROPHY, HX OF 07/09/2009  . PACEMAKER, PERMANENT 02/22/2010    Medtronic DDD  . Sinus node dysfunction     Past Surgical History  Procedure Laterality Date  . Pacemaker placement  04/2006  . Abdominal aortic aneurysm repair  04/2006  . Cholecystectomy  04/2006  . Hernia repair  1997    Left inguinal  . Prostate surgery  2009    Cancer    Current Outpatient Prescriptions  Medication Sig Dispense Refill  . doxazosin (CARDURA) 8 MG tablet Take 8 mg by mouth. 1/2 tab daily       . ferrous gluconate (FERGON) 240 (27 FE) MG tablet Take 240 mg by mouth daily.        . hydrochlorothiazide 25 MG tablet Take 25 mg by mouth daily.        . metoprolol (LOPRESSOR) 50 MG tablet Take 50 mg by mouth 2 (two) times daily.        . Omega-3 Fatty Acids (FISH OIL) 1200 MG CAPS Take 1,200 mg by mouth 2 (two) times daily.       . timolol (TIMOPTIC-XE) 0.25 % ophthalmic gel-forming Place 1 drop into both eyes daily.         No current facility-administered medications for this visit.    No Known Allergies  Review of Systems negative except from HPI and PMH  Physical Exam BP 136/51  Pulse 74  Ht 5\' 7"  (1.702 m)  Wt 163 lb 6.4 oz (74.118 kg)  BMI 25.59 kg/m2 Well developed and well nourished in no acute distress HENT normal E  scleral and icterus clear Neck Supple JVP flat; carotids brisk and full Clear to ausculation  Regular rate and rhythm, no murmurs gallops or rub Soft with active bowel sounds No clubbing cyanosis none Edema Alert and oriented, grossly normal motor and sensory function Skin Warm and Dry  ECG  apacing with RBBB AVdelay   Assessment and  Plan

## 2013-04-23 ENCOUNTER — Ambulatory Visit (INDEPENDENT_AMBULATORY_CARE_PROVIDER_SITE_OTHER): Payer: Medicare Other | Admitting: Family Medicine

## 2013-04-23 DIAGNOSIS — I10 Essential (primary) hypertension: Secondary | ICD-10-CM

## 2013-04-23 DIAGNOSIS — R5383 Other fatigue: Secondary | ICD-10-CM

## 2013-04-23 NOTE — Progress Notes (Signed)
  Subjective:    Patient ID: Shane Simpson, male    DOB: 1920-11-04, 79 y.o.   MRN: [id number]  HPI Medical followup Patient history of hypertension, prior Bell's palsy, history of prostate cancer, macular degeneration, and glaucoma history. Medications reviewed. Compliant with all. No recent specific complaints. Does have some nonspecific fatigue otherwise feels well. No recent falls. Good appetite. No chest pains.  Past Medical History  Diagnosis Date  . HYPERLIPIDEMIA 07/09/2009  . BELLS PALSY 07/09/2009  . GLUCOMA 07/09/2009  . Essential hypertension, benign 07/09/2009  . HYPERTENSION 08/06/2009  . INGUINAL HERNIA, RIGHT 08/06/2009  . CONSTIPATION 08/06/2009  . URINARY INCONTINENCE 08/06/2009  . PSA, INCREASED 07/09/2009  . SKIN CANCER, HX OF 08/06/2009  . BENIGN PROSTATIC HYPERTROPHY, HX OF 07/09/2009  . PACEMAKER, PERMANENT 02/22/2010    Medtronic DDD  . Sinus node dysfunction    Past Surgical History  Procedure Laterality Date  . Pacemaker placement  04/2006  . Abdominal aortic aneurysm repair  04/2006  . Cholecystectomy  04/2006  . Hernia repair  1997    Left inguinal  . Prostate surgery  2009    Cancer    reports that he quit smoking about 32 years ago. He does not have any smokeless tobacco history on file. He reports that he does not drink alcohol. His drug history is not on file. family history includes Arthritis in his other; Cancer (age of onset: 70) in his other; Heart disease in his other; Hypertension in his other; and Stroke in his other. No Known Allergies]    Review of Systems  Constitutional: Positive for fatigue. Negative for appetite change and unexpected weight change.  Respiratory: Negative for cough, chest tightness and shortness of breath.   Cardiovascular: Negative for chest pain, palpitations and leg swelling.  Neurological: Negative for dizziness, syncope, weakness, light-headedness and headaches.       Objective:   Physical Exam  Constitutional: He  appears well-developed and well-nourished. He appears distressed.  HENT:  Minimal cerumen in both canals  Neck: Neck supple. No thyromegaly present.  Cardiovascular: Normal rate and regular rhythm.   Pulmonary/Chest: Effort normal and breath sounds normal. No respiratory distress. He has no wheezes. He has no rales.  Musculoskeletal: He exhibits no edema.  Lymphadenopathy:    He has no cervical adenopathy.          Assessment & Plan:  #1 hypertension. Stable at goal #2. Fatigue. Likely multifactorial. Check TSH along with basic metabolic panel

## 2013-06-30 ENCOUNTER — Ambulatory Visit (INDEPENDENT_AMBULATORY_CARE_PROVIDER_SITE_OTHER): Payer: Medicare Other | Admitting: *Deleted

## 2013-06-30 DIAGNOSIS — I495 Sick sinus syndrome: Secondary | ICD-10-CM

## 2013-06-30 DIAGNOSIS — Z950 Presence of cardiac pacemaker: Secondary | ICD-10-CM

## 2013-07-04 LAB — REMOTE PACEMAKER DEVICE
AL IMPEDENCE PM: 510 Ohm
BATTERY VOLTAGE: 2.74 V
RV LEAD AMPLITUDE: 16 mv
RV LEAD IMPEDENCE PM: 610 Ohm
VENTRICULAR PACING PM: 5

## 2013-07-29 ENCOUNTER — Encounter: Payer: Self-pay | Admitting: *Deleted

## 2013-09-02 ENCOUNTER — Encounter: Payer: Self-pay | Admitting: Internal Medicine

## 2013-10-06 ENCOUNTER — Ambulatory Visit (INDEPENDENT_AMBULATORY_CARE_PROVIDER_SITE_OTHER): Payer: Medicare Other | Admitting: *Deleted

## 2013-10-06 DIAGNOSIS — Z950 Presence of cardiac pacemaker: Secondary | ICD-10-CM

## 2013-10-06 DIAGNOSIS — I495 Sick sinus syndrome: Secondary | ICD-10-CM

## 2013-10-12 LAB — MDC_IDC_ENUM_SESS_TYPE_REMOTE
Battery Remaining Longevity: 25 mo
Brady Statistic AS VP Percent: 0 %
Brady Statistic AS VS Percent: 5 %
Date Time Interrogation Session: 20141110231932
Lead Channel Impedance Value: 623 Ohm
Lead Channel Pacing Threshold Amplitude: 0.5 V
Lead Channel Pacing Threshold Amplitude: 0.625 V
Lead Channel Sensing Intrinsic Amplitude: 16 mV
Lead Channel Setting Pacing Amplitude: 2 V
Lead Channel Setting Sensing Sensitivity: 2.8 mV

## 2013-10-29 ENCOUNTER — Encounter: Payer: Self-pay | Admitting: Family Medicine

## 2013-10-29 ENCOUNTER — Ambulatory Visit (INDEPENDENT_AMBULATORY_CARE_PROVIDER_SITE_OTHER): Payer: Medicare Other | Admitting: Family Medicine

## 2013-10-29 VITALS — BP 128/66 | HR 77 | Temp 98.0°F | Wt 168.0 lb

## 2013-10-29 DIAGNOSIS — Z87898 Personal history of other specified conditions: Secondary | ICD-10-CM

## 2013-10-29 DIAGNOSIS — I10 Essential (primary) hypertension: Secondary | ICD-10-CM

## 2013-10-29 LAB — BASIC METABOLIC PANEL
CO2: 33 mEq/L — ABNORMAL HIGH (ref 19–32)
Calcium: 9.6 mg/dL (ref 8.4–10.5)
Sodium: 140 mEq/L (ref 135–145)

## 2013-10-29 NOTE — Progress Notes (Signed)
   Subjective:    Patient ID: Shane Simpson, male    DOB: [birth year], 79 y.o.   MRN: [id number]  HPI Patient seen for medical followup. He has history of hypertension, BPH, Bell's palsy, hyperlipidemia, macular degeneration. He still drives and is very steady on his feet. No recent falls. No orthostasis. He is taken doxazosin 4 mg at night for years for his BPH and those symptoms are stable. No recent chest pains. Compliant with medications. Has already had flu vaccine.  Past Medical History  Diagnosis Date  . HYPERLIPIDEMIA 07/09/2009  . BELLS PALSY 07/09/2009  . GLUCOMA 07/09/2009  . Essential hypertension, benign 07/09/2009  . HYPERTENSION 08/06/2009  . INGUINAL HERNIA, RIGHT 08/06/2009  . CONSTIPATION 08/06/2009  . URINARY INCONTINENCE 08/06/2009  . PSA, INCREASED 07/09/2009  . SKIN CANCER, HX OF 08/06/2009  . BENIGN PROSTATIC HYPERTROPHY, HX OF 07/09/2009  . PACEMAKER, PERMANENT 02/22/2010    Medtronic DDD  . Sinus node dysfunction    Past Surgical History  Procedure Laterality Date  . Pacemaker placement  04/2006  . Abdominal aortic aneurysm repair  04/2006  . Cholecystectomy  04/2006  . Hernia repair  1997    Left inguinal  . Prostate surgery  2009    Cancer    reports that he quit smoking about 32 years ago. He does not have any smokeless tobacco history on file. He reports that he does not drink alcohol. His drug history is not on file. family history includes Arthritis in his other; Cancer (age of onset: 11) in his other; Heart disease in his other; Hypertension in his other; Stroke in his other. No Known Allergies    Review of Systems  Constitutional: Negative for appetite change, fatigue and unexpected weight change.  Eyes: Negative for visual disturbance.  Respiratory: Negative for cough, chest tightness and shortness of breath.   Cardiovascular: Negative for chest pain, palpitations and leg swelling.  Gastrointestinal: Negative for abdominal pain.  Endocrine: Negative  for polydipsia and polyuria.  Neurological: Negative for dizziness, syncope, weakness, light-headedness and headaches.  Psychiatric/Behavioral: Negative for confusion and dysphoric mood.       Objective:   Physical Exam  Constitutional: He is oriented to person, place, and time. He appears well-developed and well-nourished. No distress.  HENT:  Head: Normocephalic and atraumatic.  Mouth/Throat: Oropharynx is clear and moist.  Eyes: Conjunctivae and EOM are normal. Pupils are equal, round, and reactive to light.  Neck: Normal range of motion. Neck supple. No thyromegaly present.  Cardiovascular: Normal rate, regular rhythm and normal heart sounds.   Pulmonary/Chest: No respiratory distress. He has no wheezes. He has no rales.  Abdominal: Soft. Bowel sounds are normal.  Musculoskeletal: He exhibits no edema.  Lymphadenopathy:    He has no cervical adenopathy.  Neurological: He is alert and oriented to person, place, and time.  Psychiatric: He has a normal mood and affect.          Assessment & Plan:  #1 hypertension. Stable. Check basic metabolic panel with patient on HCTZ #2 history of BPH symptomatically stable. Has been on low-dose Cardura for several years and stable #3 health maintenance. Flu vaccine already given. We discussed Prevnar 13 and at this point they wish to wait

## 2013-10-29 NOTE — Progress Notes (Signed)
Pre visit review using our clinic review tool, if applicable. No additional management support is needed unless otherwise documented below in the visit note. 

## 2013-11-05 ENCOUNTER — Encounter: Payer: Self-pay | Admitting: *Deleted

## 2013-11-10 ENCOUNTER — Encounter: Payer: Self-pay | Admitting: Internal Medicine

## 2014-01-09 ENCOUNTER — Ambulatory Visit (INDEPENDENT_AMBULATORY_CARE_PROVIDER_SITE_OTHER): Payer: Medicare Other | Admitting: *Deleted

## 2014-01-09 DIAGNOSIS — I495 Sick sinus syndrome: Secondary | ICD-10-CM

## 2014-01-09 LAB — MDC_IDC_ENUM_SESS_TYPE_REMOTE
Brady Statistic AS VP Percent: 0.1 %
Lead Channel Impedance Value: 551 Ohm
Lead Channel Impedance Value: 634 Ohm
Lead Channel Pacing Threshold Amplitude: 0.5 V
Lead Channel Pacing Threshold Amplitude: 0.625 V
Lead Channel Pacing Threshold Pulse Width: 0.4 ms
Lead Channel Sensing Intrinsic Amplitude: 16 mV
Lead Channel Setting Pacing Amplitude: 2 V
MDC IDC MSMT LEADCHNL RV PACING THRESHOLD PULSEWIDTH: 0.4 ms
MDC IDC SET LEADCHNL RV PACING AMPLITUDE: 2.5 V
MDC IDC SET LEADCHNL RV PACING PULSEWIDTH: 0.4 ms
MDC IDC SET LEADCHNL RV SENSING SENSITIVITY: 2.8 mV
MDC IDC STAT BRADY AP VP PERCENT: 4.5 %
MDC IDC STAT BRADY AP VS PERCENT: 90.5 %
MDC IDC STAT BRADY AS VS PERCENT: 4.9 %

## 2014-02-16 ENCOUNTER — Encounter: Payer: Self-pay | Admitting: *Deleted

## 2014-02-24 ENCOUNTER — Encounter: Payer: Self-pay | Admitting: Internal Medicine

## 2014-04-01 ENCOUNTER — Encounter: Payer: Medicare Other | Admitting: Internal Medicine

## 2014-04-15 ENCOUNTER — Encounter: Payer: Self-pay | Admitting: Internal Medicine

## 2014-04-15 ENCOUNTER — Ambulatory Visit (INDEPENDENT_AMBULATORY_CARE_PROVIDER_SITE_OTHER): Payer: Medicare Other | Admitting: Internal Medicine

## 2014-04-15 VITALS — BP 134/65 | HR 82 | Ht 67.0 in | Wt 167.0 lb

## 2014-04-15 DIAGNOSIS — I495 Sick sinus syndrome: Secondary | ICD-10-CM

## 2014-04-15 DIAGNOSIS — Z950 Presence of cardiac pacemaker: Secondary | ICD-10-CM

## 2014-04-15 NOTE — Patient Instructions (Signed)
Remote monitoring is used to monitor your Pacemaker from home. This monitoring reduces the number of office visits required to check your device to one time per year. It allows [place] to keep an eye on the functioning of your device to ensure it is working properly. You are scheduled for a device check from home on 07-20-2014. You may send your transmission at any time that day. If you have a wireless device, the transmission will be sent automatically. After your physician reviews your transmission, you will receive a postcard with your next transmission date.  Your physician recommends that you schedule a follow-up appointment in: 12 months with Dr. Caryl Comes

## 2014-04-15 NOTE — Progress Notes (Signed)
      Patient Care Team: Eulas Post, MD as PCP - General   HPI  Shane Simpson is a 80 y.o. male Seen for sinus node dysfunction associated with syncope. He is status post pacemaker implantation  Review of the old chart demonstrates a Myoview scan done at the hospital in 2007 which was normal  Mild DOE No chest pain or edema   Past Medical History  Diagnosis Date  . HYPERLIPIDEMIA 07/09/2009  . BELLS PALSY 07/09/2009  . GLUCOMA 07/09/2009  . Essential hypertension, benign 07/09/2009  . HYPERTENSION 08/06/2009  . INGUINAL HERNIA, RIGHT 08/06/2009  . CONSTIPATION 08/06/2009  . URINARY INCONTINENCE 08/06/2009  . PSA, INCREASED 07/09/2009  . SKIN CANCER, HX OF 08/06/2009  . BENIGN PROSTATIC HYPERTROPHY, HX OF 07/09/2009  . PACEMAKER, PERMANENT 02/22/2010    Medtronic DDD  . Sinus node dysfunction     Past Surgical History  Procedure Laterality Date  . Pacemaker placement  04/2006  . Abdominal aortic aneurysm repair  04/2006  . Cholecystectomy  04/2006  . Hernia repair  1997    Left inguinal  . Prostate surgery  2009    Cancer    Current Outpatient Prescriptions  Medication Sig Dispense Refill  . doxazosin (CARDURA) 8 MG tablet Take 8 mg by mouth. 1/2 tab daily       . hydrochlorothiazide 25 MG tablet Take 25 mg by mouth daily.        Marland Kitchen latanoprost (XALATAN) 0.005 % ophthalmic solution       . metoprolol (LOPRESSOR) 50 MG tablet Take 50 mg by mouth 2 (two) times daily.        . Omega-3 Fatty Acids (FISH OIL) 1200 MG CAPS Take 1,200 mg by mouth 2 (two) times daily.       . timolol (TIMOPTIC-XE) 0.25 % ophthalmic gel-forming Place 1 drop into both eyes daily.         No current facility-administered medications for this visit.    No Known Allergies  Review of Systems negative except from HPI and PMH  Physical Exam BP 134/65  Pulse 82  Ht 5\' 7"  (1.702 m)  Wt 167 lb (75.751 kg)  BMI 26.15 kg/m2 Well developed and nourished in no acute distress HENT normal Neck  supple with JVP-flat Clear Regular rate and rhythm, no murmurs or gallops Abd-soft with active BS No Clubbing cyanosis edema Skin-warm and dry A & Oriented  Grossly normal sensory and motor function     Assessment and  Plan  Sinus node dysfunction  Pacemaker medtronic  Stable   No changes

## 2014-04-16 ENCOUNTER — Encounter: Payer: Self-pay | Admitting: Internal Medicine

## 2014-04-16 LAB — MDC_IDC_ENUM_SESS_TYPE_INCLINIC
Battery Remaining Longevity: 22 mo
Brady Statistic AP VP Percent: 4 %
Brady Statistic AP VS Percent: 91 %
Brady Statistic AS VP Percent: 0 %
Date Time Interrogation Session: 20150520182442
Lead Channel Impedance Value: 632 Ohm
Lead Channel Pacing Threshold Amplitude: 0.5 V
Lead Channel Pacing Threshold Amplitude: 0.5 V
Lead Channel Pacing Threshold Pulse Width: 0.4 ms
Lead Channel Sensing Intrinsic Amplitude: 4 mV
Lead Channel Sensing Intrinsic Amplitude: 8 mV
MDC IDC MSMT BATTERY IMPEDANCE: 2233 Ohm
MDC IDC MSMT BATTERY VOLTAGE: 2.72 V
MDC IDC MSMT LEADCHNL RA IMPEDANCE VALUE: 535 Ohm
MDC IDC MSMT LEADCHNL RV PACING THRESHOLD PULSEWIDTH: 0.4 ms
MDC IDC SET LEADCHNL RA PACING AMPLITUDE: 2 V
MDC IDC SET LEADCHNL RV PACING AMPLITUDE: 2.5 V
MDC IDC SET LEADCHNL RV PACING PULSEWIDTH: 0.4 ms
MDC IDC SET LEADCHNL RV SENSING SENSITIVITY: 2.8 mV
MDC IDC STAT BRADY AS VS PERCENT: 5 %

## 2014-04-29 ENCOUNTER — Encounter: Payer: Self-pay | Admitting: Family Medicine

## 2014-04-29 ENCOUNTER — Ambulatory Visit (INDEPENDENT_AMBULATORY_CARE_PROVIDER_SITE_OTHER): Payer: Medicare Other | Admitting: Family Medicine

## 2014-04-29 ENCOUNTER — Telehealth: Payer: Self-pay | Admitting: Family Medicine

## 2014-04-29 VITALS — BP 128/70 | HR 72 | Temp 97.8°F | Wt 166.0 lb

## 2014-04-29 DIAGNOSIS — M25529 Pain in unspecified elbow: Secondary | ICD-10-CM

## 2014-04-29 DIAGNOSIS — M25521 Pain in right elbow: Secondary | ICD-10-CM

## 2014-04-29 DIAGNOSIS — I10 Essential (primary) hypertension: Secondary | ICD-10-CM

## 2014-04-29 MED ORDER — CEPHALEXIN 500 MG PO CAPS: 500.0000 mg | ORAL_CAPSULE | Freq: Three times a day (TID) | ORAL | Status: DC

## 2014-04-29 NOTE — Progress Notes (Signed)
Pre visit review using our clinic review tool, if applicable. No additional management support is needed unless otherwise documented below in the visit note. 

## 2014-04-29 NOTE — Telephone Encounter (Signed)
Relevant patient education mailed to patient.  

## 2014-04-29 NOTE — Patient Instructions (Signed)
Cellulitis Cellulitis is an infection of the skin and the tissue beneath it. The infected area is usually red and tender. Cellulitis occurs most often in the arms and lower legs.  CAUSES  Cellulitis is caused by bacteria that enter the skin through cracks or cuts in the skin. The most common types of bacteria that cause cellulitis are Staphylococcus and Streptococcus. SYMPTOMS   Redness and warmth.  Swelling.  Tenderness or pain.  Fever. DIAGNOSIS  Your caregiver can usually determine what is wrong based on a physical exam. Blood tests may also be done. TREATMENT  Treatment usually involves taking an antibiotic medicine. HOME CARE INSTRUCTIONS   Take your antibiotics as directed. Finish them even if you start to feel better.  Keep the infected arm or leg elevated to reduce swelling.  Apply a warm cloth to the affected area up to 4 times per day to relieve pain.  Only take over-the-counter or prescription medicines for pain, discomfort, or fever as directed by your caregiver.  Keep all follow-up appointments as directed by your caregiver. SEEK MEDICAL CARE IF:   You notice red streaks coming from the infected area.  Your red area gets larger or turns dark in color.  Your bone or joint underneath the infected area becomes painful after the skin has healed.  Your infection returns in the same area or another area.  You notice a swollen bump in the infected area.  You develop new symptoms. SEEK IMMEDIATE MEDICAL CARE IF:   You have a fever.  You feel very sleepy.  You develop vomiting or diarrhea.  You have a general ill feeling (malaise) with muscle aches and pains. MAKE SURE YOU:   Understand these instructions.  Will watch your condition.  Will get help right away if you are not doing well or get worse. Document Released: 08/23/2005 Document Revised: 05/14/2012 Document Reviewed: 01/29/2012 [employer] Patient Information 2014 [employer].  

## 2014-04-29 NOTE — Progress Notes (Signed)
   Subjective:    Patient ID: Shane Simpson, male    DOB: [birth year], 80 y.o.   MRN: [id number]  HPI Patient here for routine medical followup.  His chronic problems include history of hyperlipidemia, hypertension, Bell's palsy with some residual symptoms, cough,, intermittent constipation, BPH, macular degeneration.  Hypertension stable. He remains on metoprolol, HCTZ, and doxazosin. BPH symptoms stable. No recent chest pains. No recent falls.  Acute issue of right elbow pain. Location is olecranon bursa region. No history of trauma. No fevers or chills. No history of gout. He has noted warmth along with some mild swelling and pain. Took some Advil this morning which helped his pain. Pain seems to be worse at night. No prior history of similar issue.  Past Medical History  Diagnosis Date  . HYPERLIPIDEMIA 07/09/2009  . BELLS PALSY 07/09/2009  . GLUCOMA 07/09/2009  . Essential hypertension, benign 07/09/2009  . HYPERTENSION 08/06/2009  . INGUINAL HERNIA, RIGHT 08/06/2009  . CONSTIPATION 08/06/2009  . URINARY INCONTINENCE 08/06/2009  . PSA, INCREASED 07/09/2009  . SKIN CANCER, HX OF 08/06/2009  . BENIGN PROSTATIC HYPERTROPHY, HX OF 07/09/2009  . PACEMAKER, PERMANENT 02/22/2010    Medtronic DDD  . Sinus node dysfunction    Past Surgical History  Procedure Laterality Date  . Pacemaker placement  04/2006  . Abdominal aortic aneurysm repair  04/2006  . Cholecystectomy  04/2006  . Hernia repair  1997    Left inguinal  . Prostate surgery  2009    Cancer    reports that he quit smoking about 33 years ago. He does not have any smokeless tobacco history on file. He reports that he does not drink alcohol. His drug history is not on file. family history includes Arthritis in his other; Cancer (age of onset: 91) in his other; Heart disease in his other; Hypertension in his other; Stroke in his other. No Known Allergies    Review of Systems  Constitutional: Negative for fever, chills and fatigue.   Eyes: Negative for visual disturbance.  Respiratory: Negative for cough, chest tightness and shortness of breath.   Cardiovascular: Negative for chest pain, palpitations and leg swelling.  Neurological: Negative for dizziness, syncope, weakness, light-headedness and headaches.       Objective:   Physical Exam  Constitutional: He appears well-developed and well-nourished.  Cardiovascular: Normal rate and regular rhythm.   Pulmonary/Chest: Effort normal and breath sounds normal. No respiratory distress. He has no wheezes. He has no rales.  Musculoskeletal:  Right elbow reveals some redness around the olecranon region. Minimal edema of the olecranon bursa. No pustules. He has some erythema and warmth. Faint erythema extending to the proximal right forearm. Full range of motion right elbow          Assessment & Plan:  #1 hypertension. Stable at goal. Continue current medications. Check basic metabolic panel at followup #2 right elbow pain. Question cellulitis. No prior history of gout but this would also be in differential. Gout flareup would seem unlikely at age 61 with no prior history. Start Keflex 500 mg 3 times a day for 10 days. He'll try some low-grade heat with heating pad and elevation. Reassess in 5 days and sooner as needed.

## 2014-05-04 ENCOUNTER — Encounter: Payer: Self-pay | Admitting: Family Medicine

## 2014-05-04 ENCOUNTER — Ambulatory Visit (INDEPENDENT_AMBULATORY_CARE_PROVIDER_SITE_OTHER): Payer: Medicare Other | Admitting: Family Medicine

## 2014-05-04 VITALS — BP 130/66 | HR 83 | Temp 98.0°F | Wt 166.0 lb

## 2014-05-04 DIAGNOSIS — M25529 Pain in unspecified elbow: Secondary | ICD-10-CM

## 2014-05-04 DIAGNOSIS — M25521 Pain in right elbow: Secondary | ICD-10-CM

## 2014-05-04 NOTE — Progress Notes (Signed)
Pre visit review using our clinic review tool, if applicable. No additional management support is needed unless otherwise documented below in the visit note. 

## 2014-05-04 NOTE — Patient Instructions (Signed)

## 2014-05-04 NOTE — Progress Notes (Signed)
   Subjective:    Patient ID: Shane Simpson, male    DOB: 1920/02/18, 80 y.o.   MRN: [id number]  HPI Followup right elbow pain. Refer to last note. We included differential cellulitis versus possible gout. No prior history of gout but his father had gout history. Started Keflex and his elbow is improved. Less erythema. No swelling. Less pain. No fevers or chills. No drainage. No recent injury. He has some osteoarthritis involving multiple joints but no evidence of previous acute monoarticular arthritis. No alcohol use. Does take HCTZ for hypertension. Does not consume a lot of red meats /organ meats.  Past Medical History  Diagnosis Date  . HYPERLIPIDEMIA 07/09/2009  . BELLS PALSY 07/09/2009  . GLUCOMA 07/09/2009  . Essential hypertension, benign 07/09/2009  . HYPERTENSION 08/06/2009  . INGUINAL HERNIA, RIGHT 08/06/2009  . CONSTIPATION 08/06/2009  . URINARY INCONTINENCE 08/06/2009  . PSA, INCREASED 07/09/2009  . SKIN CANCER, HX OF 08/06/2009  . BENIGN PROSTATIC HYPERTROPHY, HX OF 07/09/2009  . PACEMAKER, PERMANENT 02/22/2010    Medtronic DDD  . Sinus node dysfunction    Past Surgical History  Procedure Laterality Date  . Pacemaker placement  04/2006  . Abdominal aortic aneurysm repair  04/2006  . Cholecystectomy  04/2006  . Hernia repair  1997    Left inguinal  . Prostate surgery  2009    Cancer    reports that he quit smoking about 33 years ago. He does not have any smokeless tobacco history on file. He reports that he does not drink alcohol. His drug history is not on file. family history includes Arthritis in his other; Cancer (age of onset: 107) in his other; Heart disease in his other; Hypertension in his other; Stroke in his other. No Known Allergies    Review of Systems  Constitutional: Negative for fever and chills.  Musculoskeletal: Positive for arthralgias.  Skin: Negative for rash.  Hematological: Negative for adenopathy.       Objective:   Physical Exam    Constitutional: He appears well-developed and well-nourished.  Cardiovascular: Normal rate and regular rhythm.   Pulmonary/Chest: Effort normal and breath sounds normal. No respiratory distress. He has no wheezes. He has no rales.  Musculoskeletal:  Right elbow reveals some swelling along the olecranon bursa region. Much less erythema and swelling apparently last visit. Still slightly warm to touch and slightly tender. 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 Suspect gout based on appearance today. Cannot rule out some recent cellulitis changes .  We recommended aspiration of olecranon bursa fluid to further assess and patient consented. Prepped skin with Betadine. Using 23-gauge one-inch needle aspirated some chalky cloudy fluid. Sent for crystal analysis. This is nonpurulent appearing Discussed possible use of prophylactic medications eventually but at this point he wishes to observe this has not had any other recurrence. Cautious use of Advil at his age

## 2014-05-05 LAB — SYNOVIAL FLUID, CRYSTAL

## 2014-07-20 ENCOUNTER — Ambulatory Visit (INDEPENDENT_AMBULATORY_CARE_PROVIDER_SITE_OTHER): Payer: Medicare Other | Admitting: *Deleted

## 2014-07-20 DIAGNOSIS — Z950 Presence of cardiac pacemaker: Secondary | ICD-10-CM

## 2014-07-20 DIAGNOSIS — I495 Sick sinus syndrome: Secondary | ICD-10-CM

## 2014-07-20 NOTE — Progress Notes (Signed)
Remote pacemaker transmission.   

## 2014-07-22 LAB — MDC_IDC_ENUM_SESS_TYPE_REMOTE
Battery Impedance: 2329 Ohm
Battery Remaining Longevity: 21 mo
Battery Voltage: 2.72 V
Brady Statistic AP VP Percent: 2 %
Brady Statistic AS VS Percent: 6 %
Lead Channel Pacing Threshold Pulse Width: 0.4 ms
Lead Channel Pacing Threshold Pulse Width: 0.4 ms
Lead Channel Setting Pacing Amplitude: 2 V
Lead Channel Setting Pacing Amplitude: 2.5 V
Lead Channel Setting Pacing Pulse Width: 0.4 ms
Lead Channel Setting Sensing Sensitivity: 2.8 mV
MDC IDC MSMT LEADCHNL RA IMPEDANCE VALUE: 532 Ohm
MDC IDC MSMT LEADCHNL RA PACING THRESHOLD AMPLITUDE: 0.625 V
MDC IDC MSMT LEADCHNL RV IMPEDANCE VALUE: 632 Ohm
MDC IDC MSMT LEADCHNL RV PACING THRESHOLD AMPLITUDE: 0.625 V
MDC IDC MSMT LEADCHNL RV SENSING INTR AMPL: 5.6 mV
MDC IDC SESS DTM: 20150824121112
MDC IDC STAT BRADY AP VS PERCENT: 92 %
MDC IDC STAT BRADY AS VP PERCENT: 0 %

## 2014-07-28 ENCOUNTER — Encounter: Payer: Self-pay | Admitting: Cardiology

## 2014-08-06 ENCOUNTER — Encounter: Payer: Self-pay | Admitting: Internal Medicine

## 2014-10-21 ENCOUNTER — Ambulatory Visit (INDEPENDENT_AMBULATORY_CARE_PROVIDER_SITE_OTHER): Payer: Medicare Other | Admitting: *Deleted

## 2014-10-21 DIAGNOSIS — I495 Sick sinus syndrome: Secondary | ICD-10-CM

## 2014-10-21 NOTE — Progress Notes (Signed)
Remote pacemaker transmission.   

## 2014-10-24 LAB — MDC_IDC_ENUM_SESS_TYPE_REMOTE
Battery Impedance: 2452 Ohm
Brady Statistic AP VP Percent: 2 %
Brady Statistic AP VS Percent: 92 %
Brady Statistic AS VS Percent: 6 %
Date Time Interrogation Session: 20151125140144
Lead Channel Impedance Value: 549 Ohm
Lead Channel Impedance Value: 664 Ohm
Lead Channel Pacing Threshold Amplitude: 0.625 V
Lead Channel Sensing Intrinsic Amplitude: 5.6 mV
Lead Channel Setting Pacing Amplitude: 2.5 V
Lead Channel Setting Sensing Sensitivity: 2.8 mV
MDC IDC MSMT BATTERY REMAINING LONGEVITY: 20 mo
MDC IDC MSMT BATTERY VOLTAGE: 2.72 V
MDC IDC MSMT LEADCHNL RA PACING THRESHOLD AMPLITUDE: 0.5 V
MDC IDC MSMT LEADCHNL RA PACING THRESHOLD PULSEWIDTH: 0.4 ms
MDC IDC MSMT LEADCHNL RV PACING THRESHOLD PULSEWIDTH: 0.4 ms
MDC IDC SET LEADCHNL RA PACING AMPLITUDE: 2 V
MDC IDC SET LEADCHNL RV PACING PULSEWIDTH: 0.4 ms
MDC IDC STAT BRADY AS VP PERCENT: 0 %

## 2014-10-28 ENCOUNTER — Ambulatory Visit: Payer: Medicare Other | Admitting: Family Medicine

## 2014-11-02 ENCOUNTER — Encounter: Payer: Self-pay | Admitting: Cardiology

## 2014-11-04 ENCOUNTER — Encounter: Payer: Self-pay | Admitting: Family Medicine

## 2014-11-04 ENCOUNTER — Ambulatory Visit (INDEPENDENT_AMBULATORY_CARE_PROVIDER_SITE_OTHER): Payer: Medicare Other | Admitting: Family Medicine

## 2014-11-04 VITALS — BP 130/68 | HR 82 | Temp 98.3°F | Wt 156.0 lb

## 2014-11-04 DIAGNOSIS — N183 Chronic kidney disease, stage 3 unspecified: Secondary | ICD-10-CM

## 2014-11-04 DIAGNOSIS — I10 Essential (primary) hypertension: Secondary | ICD-10-CM

## 2014-11-04 LAB — BASIC METABOLIC PANEL
BUN: 43 mg/dL — ABNORMAL HIGH (ref 6–23)
CO2: 33 meq/L — AB (ref 19–32)
Calcium: 9 mg/dL (ref 8.4–10.5)
Chloride: 98 mEq/L (ref 96–112)
Creatinine, Ser: 1.5 mg/dL (ref 0.4–1.5)
GFR: 46.14 mL/min — ABNORMAL LOW (ref >60.00)
GLUCOSE: 90 mg/dL (ref 70–99)
POTASSIUM: 3.6 meq/L (ref 3.5–5.1)
SODIUM: 140 meq/L (ref 135–145)

## 2014-11-04 NOTE — Progress Notes (Signed)
   Subjective:    Patient ID: Shane Simpson, male    DOB: September 06, 1920, 80 y.o.   MRN: [id number]  HPI Medical follow-up. He has history of macular degeneration, hypertension, glaucoma, Bell's palsy. He's been maintained for several years on HCTZ, metoprolol, and Cardura. No dizziness. He is dealing with stress of his wife recently placed in nursing home after, patient from fall and vertebral fractures. He has very supportive family. Accompanied by son today. He denies any recent falls himself. No chest pains. He has chronic kidney disease with creatinine clearance around 40  Past Medical History  Diagnosis Date  . HYPERLIPIDEMIA 07/09/2009  . BELLS PALSY 07/09/2009  . GLUCOMA 07/09/2009  . Essential hypertension, benign 07/09/2009  . HYPERTENSION 08/06/2009  . INGUINAL HERNIA, RIGHT 08/06/2009  . CONSTIPATION 08/06/2009  . URINARY INCONTINENCE 08/06/2009  . PSA, INCREASED 07/09/2009  . SKIN CANCER, HX OF 08/06/2009  . BENIGN PROSTATIC HYPERTROPHY, HX OF 07/09/2009  . PACEMAKER, PERMANENT 02/22/2010    Medtronic DDD  . Sinus node dysfunction    Past Surgical History  Procedure Laterality Date  . Pacemaker placement  04/2006  . Abdominal aortic aneurysm repair  04/2006  . Cholecystectomy  04/2006  . Hernia repair  1997    Left inguinal  . Prostate surgery  2009    Cancer    reports that he quit smoking about 33 years ago. He does not have any smokeless tobacco history on file. He reports that he does not drink alcohol. His drug history is not on file. family history includes Arthritis in his other; Cancer (age of onset: 44) in his other; Heart disease in his other; Hypertension in his other; Stroke in his other. No Known Allergies    Review of Systems  Constitutional: Negative for fatigue.  Eyes: Negative for visual disturbance.  Respiratory: Negative for cough, chest tightness and shortness of breath.   Cardiovascular: Negative for chest pain, palpitations and leg swelling.  Endocrine:  Negative for polydipsia and polyuria.  Neurological: Negative for dizziness, syncope, weakness, light-headedness and headaches.       Objective:   Physical Exam  Constitutional: He is oriented to person, place, and time. He appears well-developed and well-nourished.  HENT:  Right Ear: External ear normal.  Left Ear: External ear normal.  Mouth/Throat: Oropharynx is clear and moist.  Eyes: Pupils are equal, round, and reactive to light.  Neck: Neck supple. No JVD present. No thyromegaly present.  Cardiovascular: Normal rate and regular rhythm.   Pulmonary/Chest: Effort normal and breath sounds normal. No respiratory distress. He has no wheezes. He has no rales.  Musculoskeletal:  No pitting edema lower legs  Neurological: He is alert and oriented to person, place, and time.          Assessment & Plan:  Hypertension. Adequately controlled. Continue current medications. Check basic metabolic panel. Watch sodium intake. Flu vaccine already given. Routine follow-up 6 months

## 2014-11-04 NOTE — Progress Notes (Signed)
Pre visit review using our clinic review tool, if applicable. No additional management support is needed unless otherwise documented below in the visit note. 

## 2014-11-09 ENCOUNTER — Encounter: Payer: Self-pay | Admitting: Internal Medicine

## 2015-01-25 ENCOUNTER — Ambulatory Visit (INDEPENDENT_AMBULATORY_CARE_PROVIDER_SITE_OTHER): Payer: Medicare Other | Admitting: *Deleted

## 2015-01-25 DIAGNOSIS — I495 Sick sinus syndrome: Secondary | ICD-10-CM

## 2015-01-25 NOTE — Progress Notes (Signed)
Remote pacemaker transmission.   

## 2015-01-28 LAB — MDC_IDC_ENUM_SESS_TYPE_REMOTE
Battery Remaining Longevity: 17 mo
Battery Voltage: 2.7 V
Brady Statistic AP VP Percent: 1 %
Brady Statistic AS VP Percent: 0 %
Date Time Interrogation Session: 20160229171707
Lead Channel Impedance Value: 677 Ohm
Lead Channel Pacing Threshold Amplitude: 0.5 V
Lead Channel Pacing Threshold Pulse Width: 0.4 ms
Lead Channel Sensing Intrinsic Amplitude: 5.6 mV
Lead Channel Setting Pacing Amplitude: 2 V
Lead Channel Setting Pacing Amplitude: 2.5 V
Lead Channel Setting Pacing Pulse Width: 0.4 ms
Lead Channel Setting Sensing Sensitivity: 2.8 mV
MDC IDC MSMT BATTERY IMPEDANCE: 2766 Ohm
MDC IDC MSMT LEADCHNL RA IMPEDANCE VALUE: 577 Ohm
MDC IDC MSMT LEADCHNL RV PACING THRESHOLD AMPLITUDE: 0.75 V
MDC IDC MSMT LEADCHNL RV PACING THRESHOLD PULSEWIDTH: 0.4 ms
MDC IDC STAT BRADY AP VS PERCENT: 93 %
MDC IDC STAT BRADY AS VS PERCENT: 6 %

## 2015-03-02 ENCOUNTER — Encounter: Payer: Self-pay | Admitting: Cardiology

## 2015-03-11 ENCOUNTER — Encounter: Payer: Self-pay | Admitting: Internal Medicine

## 2015-04-27 ENCOUNTER — Encounter: Payer: Self-pay | Admitting: Internal Medicine

## 2015-04-27 ENCOUNTER — Ambulatory Visit (INDEPENDENT_AMBULATORY_CARE_PROVIDER_SITE_OTHER): Payer: Medicare Other | Admitting: Internal Medicine

## 2015-04-27 VITALS — BP 160/70 | HR 83 | Ht 67.0 in | Wt 156.6 lb

## 2015-04-27 DIAGNOSIS — Z45018 Encounter for adjustment and management of other part of cardiac pacemaker: Secondary | ICD-10-CM | POA: Diagnosis not present

## 2015-04-27 DIAGNOSIS — I495 Sick sinus syndrome: Secondary | ICD-10-CM | POA: Diagnosis not present

## 2015-04-27 LAB — CUP PACEART INCLINIC DEVICE CHECK
Brady Statistic AS VS Percent: 4.7 %
Lead Channel Impedance Value: 722 Ohm
Lead Channel Pacing Threshold Amplitude: 0.75 V
Lead Channel Pacing Threshold Pulse Width: 0.4 ms
Lead Channel Pacing Threshold Pulse Width: 0.4 ms
Lead Channel Sensing Intrinsic Amplitude: 8 mV
Lead Channel Setting Sensing Sensitivity: 2.8 mV
MDC IDC MSMT BATTERY IMPEDANCE: 3263 Ohm
MDC IDC MSMT BATTERY VOLTAGE: 2.69 V
MDC IDC MSMT LEADCHNL RA IMPEDANCE VALUE: 580 Ohm
MDC IDC MSMT LEADCHNL RA PACING THRESHOLD AMPLITUDE: 0.5 V
MDC IDC MSMT LEADCHNL RA SENSING INTR AMPL: 5.6 mV
MDC IDC SESS DTM: 20160531151502
MDC IDC SET LEADCHNL RA PACING AMPLITUDE: 2 V
MDC IDC SET LEADCHNL RV PACING AMPLITUDE: 2.5 V
MDC IDC SET LEADCHNL RV PACING PULSEWIDTH: 0.4 ms
MDC IDC STAT BRADY AP VP PERCENT: 1.3 %
MDC IDC STAT BRADY AP VS PERCENT: 94 %
MDC IDC STAT BRADY AS VP PERCENT: 0.1 % — AB

## 2015-04-27 NOTE — Patient Instructions (Addendum)
Medication Instructions:  Your physician recommends that you continue on your current medications as directed. Please refer to the Current Medication list given to you today.  Labwork: None ordered  Testing/Procedures: None ordered  Follow-Up: Remote monitoring is used to monitor your pacemaker from home. This monitoring reduces the number of office visits required to check your device to one time per year. It allows [place] to keep an eye on the functioning of your device to ensure it is working properly. You are scheduled for a device check from home on 07/27/2015. You may send your transmission at any time that day. If you have a wireless device, the transmission will be sent automatically. After your physician reviews your transmission, you will receive a postcard with your next transmission date.  Your physician recommends that you schedule a follow-up appointment in: 12 months with Chanetta Marshall, NP.  Thank you for choosing [facility]!!

## 2015-04-27 NOTE — Progress Notes (Signed)
      Patient Care Team: Eulas Post, MD as PCP - General   HPI  Shane Simpson is a 81 y.o. male Seen for sinus node dysfunction associated with syncope. He is status post pacemaker implantation  Review of the old chart demonstrates a Myoview scan done at the hospital in 2007 which was normal No chest pain  occasional edema. Blood pressure 1:30-140 range at home.    Past Medical History  Diagnosis Date  . HYPERLIPIDEMIA 07/09/2009  . BELLS PALSY 07/09/2009  . GLUCOMA 07/09/2009  . Essential hypertension, benign 07/09/2009  . HYPERTENSION 08/06/2009  . INGUINAL HERNIA, RIGHT 08/06/2009  . CONSTIPATION 08/06/2009  . URINARY INCONTINENCE 08/06/2009  . PSA, INCREASED 07/09/2009  . SKIN CANCER, HX OF 08/06/2009  . BENIGN PROSTATIC HYPERTROPHY, HX OF 07/09/2009  . PACEMAKER, PERMANENT 02/22/2010    Medtronic DDD  . Sinus node dysfunction     Past Surgical History  Procedure Laterality Date  . Pacemaker placement  04/2006  . Abdominal aortic aneurysm repair  04/2006  . Cholecystectomy  04/2006  . Hernia repair  1997    Left inguinal  . Prostate surgery  2009    Cancer    Current Outpatient Prescriptions  Medication Sig Dispense Refill  . hydrochlorothiazide 25 MG tablet Take 25 mg by mouth daily.      Marland Kitchen latanoprost (XALATAN) 0.005 % ophthalmic solution Place 1 drop into both eyes at bedtime.     . metoprolol (LOPRESSOR) 50 MG tablet Take 50 mg by mouth 2 (two) times daily.      . Omega-3 Fatty Acids (FISH OIL) 1200 MG CAPS Take 1,200 mg by mouth 2 (two) times daily.     . timolol (TIMOPTIC-XE) 0.25 % ophthalmic gel-forming Place 1 drop into both eyes daily.       No current facility-administered medications for this visit.    No Known Allergies  Review of Systems negative except from HPI and PMH  Physical Exam BP 160/70 mmHg  Pulse 83  Ht 5\' 7"  (1.702 m)  Wt 156 lb 9.6 oz (71.033 kg)  BMI 24.52 kg/m2 Well developed and nourished in no acute distress HENT normal  x right ptosis Neck supple with JVP-flat Device pocket well healed; without hematoma or erythema.  There is no tethering  Clear Regular rate and rhythm, no murmurs or gallops Abd-soft with active BS No Clubbing cyanosis edema Skin-warm and dry A & Oriented  Grossly normal sensory and motor function     Assessment and  Plan  Sinus node dysfunction  Pacemaker medtronic  Stable   No changes  His wife died 6 months ago after 35 years.

## 2015-07-27 ENCOUNTER — Ambulatory Visit (INDEPENDENT_AMBULATORY_CARE_PROVIDER_SITE_OTHER): Payer: Medicare Other | Admitting: *Deleted

## 2015-07-27 DIAGNOSIS — I495 Sick sinus syndrome: Secondary | ICD-10-CM

## 2015-07-27 NOTE — Progress Notes (Signed)
Remote pacemaker transmission.   

## 2015-08-04 LAB — CUP PACEART REMOTE DEVICE CHECK
Battery Impedance: 3990 Ohm
Battery Voltage: 2.67 V
Brady Statistic AP VS Percent: 96 %
Brady Statistic AS VP Percent: 0 %
Brady Statistic AS VS Percent: 1 %
Lead Channel Impedance Value: 599 Ohm
Lead Channel Impedance Value: 704 Ohm
Lead Channel Pacing Threshold Pulse Width: 0.4 ms
Lead Channel Pacing Threshold Pulse Width: 0.4 ms
Lead Channel Sensing Intrinsic Amplitude: 8 mV
Lead Channel Setting Pacing Amplitude: 2 V
Lead Channel Setting Sensing Sensitivity: 4 mV
MDC IDC MSMT BATTERY REMAINING LONGEVITY: 9 mo
MDC IDC MSMT LEADCHNL RA PACING THRESHOLD AMPLITUDE: 0.5 V
MDC IDC MSMT LEADCHNL RV PACING THRESHOLD AMPLITUDE: 0.625 V
MDC IDC SESS DTM: 20160830123324
MDC IDC SET LEADCHNL RV PACING AMPLITUDE: 2.5 V
MDC IDC SET LEADCHNL RV PACING PULSEWIDTH: 0.4 ms
MDC IDC STAT BRADY AP VP PERCENT: 3 %

## 2015-08-06 ENCOUNTER — Encounter: Payer: Self-pay | Admitting: Cardiology

## 2015-08-16 ENCOUNTER — Encounter: Payer: Self-pay | Admitting: Internal Medicine

## 2015-08-30 ENCOUNTER — Ambulatory Visit (INDEPENDENT_AMBULATORY_CARE_PROVIDER_SITE_OTHER): Payer: Medicare Other | Admitting: *Deleted

## 2015-08-30 DIAGNOSIS — Z950 Presence of cardiac pacemaker: Secondary | ICD-10-CM

## 2015-08-30 NOTE — Progress Notes (Signed)
Remote pacemaker transmission.   

## 2015-08-31 LAB — CUP PACEART REMOTE DEVICE CHECK
Battery Impedance: 4401 Ohm
Battery Remaining Longevity: 7 mo
Battery Voltage: 2.65 V
Date Time Interrogation Session: 20161003120651
Lead Channel Impedance Value: 582 Ohm
Lead Channel Pacing Threshold Amplitude: 0.625 V
Lead Channel Pacing Threshold Pulse Width: 0.4 ms
Lead Channel Setting Pacing Amplitude: 2 V
Lead Channel Setting Pacing Amplitude: 2.5 V
Lead Channel Setting Sensing Sensitivity: 2.8 mV
MDC IDC MSMT LEADCHNL RA PACING THRESHOLD AMPLITUDE: 0.5 V
MDC IDC MSMT LEADCHNL RA PACING THRESHOLD PULSEWIDTH: 0.4 ms
MDC IDC MSMT LEADCHNL RV IMPEDANCE VALUE: 708 Ohm
MDC IDC MSMT LEADCHNL RV SENSING INTR AMPL: 8 mV
MDC IDC SET LEADCHNL RV PACING PULSEWIDTH: 0.4 ms
MDC IDC STAT BRADY AP VP PERCENT: 3 %
MDC IDC STAT BRADY AP VS PERCENT: 96 %
MDC IDC STAT BRADY AS VP PERCENT: 0 %
MDC IDC STAT BRADY AS VS PERCENT: 1 %

## 2015-09-30 ENCOUNTER — Ambulatory Visit (INDEPENDENT_AMBULATORY_CARE_PROVIDER_SITE_OTHER): Payer: Medicare Other | Admitting: *Deleted

## 2015-09-30 DIAGNOSIS — Z950 Presence of cardiac pacemaker: Secondary | ICD-10-CM

## 2015-10-01 NOTE — Progress Notes (Signed)
Remote pacemaker transmission.   

## 2015-10-06 ENCOUNTER — Encounter: Payer: Self-pay | Admitting: Cardiology

## 2015-10-12 ENCOUNTER — Encounter: Payer: Self-pay | Admitting: Internal Medicine

## 2015-10-12 ENCOUNTER — Encounter: Payer: Self-pay | Admitting: Cardiology

## 2015-10-12 LAB — CUP PACEART REMOTE DEVICE CHECK
Battery Impedance: 4936 Ohm
Battery Voltage: 2.64 V
Brady Statistic AP VP Percent: 3 %
Brady Statistic AP VS Percent: 96 %
Brady Statistic AS VP Percent: 0 %
Brady Statistic AS VS Percent: 2 %
Date Time Interrogation Session: 20161103141956
Implantable Lead Implant Date: 20070606
Implantable Lead Location: 753860
Implantable Lead Model: 5076
Lead Channel Impedance Value: 647 Ohm
Lead Channel Setting Pacing Amplitude: 2 V
Lead Channel Setting Pacing Amplitude: 2.5 V
Lead Channel Setting Pacing Pulse Width: 0.4 ms
MDC IDC LEAD IMPLANT DT: 20070606
MDC IDC LEAD LOCATION: 753859
MDC IDC MSMT BATTERY REMAINING LONGEVITY: 4 mo
MDC IDC MSMT LEADCHNL RA IMPEDANCE VALUE: 547 Ohm
MDC IDC SET LEADCHNL RV SENSING SENSITIVITY: 4 mV

## 2015-11-01 ENCOUNTER — Ambulatory Visit (INDEPENDENT_AMBULATORY_CARE_PROVIDER_SITE_OTHER): Payer: Medicare Other | Admitting: *Deleted

## 2015-11-01 DIAGNOSIS — I495 Sick sinus syndrome: Secondary | ICD-10-CM | POA: Diagnosis not present

## 2015-11-01 NOTE — Progress Notes (Signed)
Remote pacemaker transmission.   

## 2015-11-10 LAB — CUP PACEART REMOTE DEVICE CHECK
Battery Impedance: 5761 Ohm
Battery Voltage: 2.61 V
Brady Statistic AP VS Percent: 96 %
Date Time Interrogation Session: 20161205151809
Implantable Lead Implant Date: 20070606
Implantable Lead Location: 753859
Implantable Lead Model: 5076
Lead Channel Impedance Value: 559 Ohm
Lead Channel Impedance Value: 725 Ohm
Lead Channel Pacing Threshold Amplitude: 0.5 V
Lead Channel Pacing Threshold Pulse Width: 0.4 ms
Lead Channel Setting Pacing Amplitude: 2 V
Lead Channel Setting Pacing Amplitude: 2.5 V
Lead Channel Setting Sensing Sensitivity: 4 mV
MDC IDC LEAD IMPLANT DT: 20070606
MDC IDC LEAD LOCATION: 753860
MDC IDC MSMT BATTERY REMAINING LONGEVITY: 0 mo
MDC IDC MSMT LEADCHNL RA PACING THRESHOLD PULSEWIDTH: 0.4 ms
MDC IDC MSMT LEADCHNL RV PACING THRESHOLD AMPLITUDE: 0.625 V
MDC IDC MSMT LEADCHNL RV SENSING INTR AMPL: 8 mV
MDC IDC SET LEADCHNL RV PACING PULSEWIDTH: 0.4 ms
MDC IDC STAT BRADY AP VP PERCENT: 2 %
MDC IDC STAT BRADY AS VP PERCENT: 0 %
MDC IDC STAT BRADY AS VS PERCENT: 2 %

## 2015-11-17 ENCOUNTER — Encounter: Payer: Self-pay | Admitting: Cardiology

## 2015-11-24 ENCOUNTER — Encounter: Payer: Self-pay | Admitting: *Deleted

## 2015-12-02 ENCOUNTER — Telehealth: Payer: Self-pay | Admitting: Cardiology

## 2015-12-02 ENCOUNTER — Ambulatory Visit (INDEPENDENT_AMBULATORY_CARE_PROVIDER_SITE_OTHER): Payer: Medicare Other | Admitting: *Deleted

## 2015-12-02 DIAGNOSIS — Z950 Presence of cardiac pacemaker: Secondary | ICD-10-CM

## 2015-12-02 DIAGNOSIS — I495 Sick sinus syndrome: Secondary | ICD-10-CM

## 2015-12-02 NOTE — Telephone Encounter (Signed)
Spoke with pt and reminded pt of remote transmission that is due today. Pt verbalized understanding.   

## 2015-12-03 NOTE — Progress Notes (Signed)
Remote pacemaker transmission.   

## 2015-12-16 LAB — CUP PACEART REMOTE DEVICE CHECK
Battery Impedance: 6711 Ohm
Battery Remaining Longevity: 4 mo
Brady Statistic RV Percent Paced: 2 %
Date Time Interrogation Session: 20170105170319
Implantable Lead Implant Date: 20070606
Implantable Lead Implant Date: 20070606
Implantable Lead Location: 753859
Implantable Lead Model: 5076
Implantable Lead Model: 5076
Lead Channel Impedance Value: 67 Ohm
Lead Channel Impedance Value: 794 Ohm
Lead Channel Setting Pacing Amplitude: 2.5 V
Lead Channel Setting Pacing Pulse Width: 0.4 ms
MDC IDC LEAD LOCATION: 753860
MDC IDC MSMT BATTERY VOLTAGE: 2.61 V
MDC IDC SET LEADCHNL RV SENSING SENSITIVITY: 2.8 mV

## 2015-12-17 ENCOUNTER — Encounter: Payer: Self-pay | Admitting: Cardiology

## 2015-12-24 ENCOUNTER — Encounter (HOSPITAL_COMMUNITY): Payer: Self-pay

## 2015-12-24 ENCOUNTER — Emergency Department (HOSPITAL_COMMUNITY): Payer: Medicare Other

## 2015-12-24 ENCOUNTER — Inpatient Hospital Stay (HOSPITAL_COMMUNITY)
Admission: EM | Admit: 2015-12-24 | Discharge: 2015-12-31 | DRG: 194 | Disposition: A | Discharge status: Skilled Nursing Facility | Payer: Medicare Other | Attending: Internal Medicine | Admitting: Internal Medicine

## 2015-12-24 DIAGNOSIS — J90 Pleural effusion, not elsewhere classified: Secondary | ICD-10-CM

## 2015-12-24 DIAGNOSIS — E785 Hyperlipidemia, unspecified: Secondary | ICD-10-CM | POA: Diagnosis present

## 2015-12-24 DIAGNOSIS — J189 Pneumonia, unspecified organism: Principal | ICD-10-CM

## 2015-12-24 DIAGNOSIS — T451X5A Adverse effect of antineoplastic and immunosuppressive drugs, initial encounter: Secondary | ICD-10-CM | POA: Diagnosis present

## 2015-12-24 DIAGNOSIS — R7989 Other specified abnormal findings of blood chemistry: Secondary | ICD-10-CM | POA: Diagnosis not present

## 2015-12-24 DIAGNOSIS — Z87891 Personal history of nicotine dependence: Secondary | ICD-10-CM

## 2015-12-24 DIAGNOSIS — R1310 Dysphagia, unspecified: Secondary | ICD-10-CM

## 2015-12-24 DIAGNOSIS — Z8249 Family history of ischemic heart disease and other diseases of the circulatory system: Secondary | ICD-10-CM

## 2015-12-24 DIAGNOSIS — I248 Other forms of acute ischemic heart disease: Secondary | ICD-10-CM | POA: Diagnosis present

## 2015-12-24 DIAGNOSIS — N183 Chronic kidney disease, stage 3 unspecified: Secondary | ICD-10-CM | POA: Diagnosis present

## 2015-12-24 DIAGNOSIS — R1110 Vomiting, unspecified: Secondary | ICD-10-CM

## 2015-12-24 DIAGNOSIS — C4490 Unspecified malignant neoplasm of skin, unspecified: Secondary | ICD-10-CM | POA: Diagnosis present

## 2015-12-24 DIAGNOSIS — E870 Hyperosmolality and hypernatremia: Secondary | ICD-10-CM | POA: Diagnosis not present

## 2015-12-24 DIAGNOSIS — Z9049 Acquired absence of other specified parts of digestive tract: Secondary | ICD-10-CM

## 2015-12-24 DIAGNOSIS — Z950 Presence of cardiac pacemaker: Secondary | ICD-10-CM | POA: Diagnosis not present

## 2015-12-24 DIAGNOSIS — I272 Other secondary pulmonary hypertension: Secondary | ICD-10-CM | POA: Diagnosis present

## 2015-12-24 DIAGNOSIS — Z515 Encounter for palliative care: Secondary | ICD-10-CM | POA: Diagnosis present

## 2015-12-24 DIAGNOSIS — E876 Hypokalemia: Secondary | ICD-10-CM | POA: Diagnosis not present

## 2015-12-24 DIAGNOSIS — R4702 Dysphasia: Secondary | ICD-10-CM | POA: Diagnosis present

## 2015-12-24 DIAGNOSIS — I129 Hypertensive chronic kidney disease with stage 1 through stage 4 chronic kidney disease, or unspecified chronic kidney disease: Secondary | ICD-10-CM | POA: Diagnosis present

## 2015-12-24 DIAGNOSIS — Z9889 Other specified postprocedural states: Secondary | ICD-10-CM

## 2015-12-24 DIAGNOSIS — Z66 Do not resuscitate: Secondary | ICD-10-CM | POA: Diagnosis present

## 2015-12-24 DIAGNOSIS — Y95 Nosocomial condition: Secondary | ICD-10-CM | POA: Diagnosis present

## 2015-12-24 DIAGNOSIS — I10 Essential (primary) hypertension: Secondary | ICD-10-CM | POA: Diagnosis present

## 2015-12-24 DIAGNOSIS — Z823 Family history of stroke: Secondary | ICD-10-CM

## 2015-12-24 DIAGNOSIS — R0602 Shortness of breath: Secondary | ICD-10-CM | POA: Diagnosis not present

## 2015-12-24 DIAGNOSIS — K567 Ileus, unspecified: Secondary | ICD-10-CM

## 2015-12-24 DIAGNOSIS — R112 Nausea with vomiting, unspecified: Secondary | ICD-10-CM | POA: Diagnosis present

## 2015-12-24 DIAGNOSIS — R778 Other specified abnormalities of plasma proteins: Secondary | ICD-10-CM | POA: Diagnosis present

## 2015-12-24 LAB — TROPONIN I
TROPONIN I: 0.04 ng/mL — AB (ref <0.031)
TROPONIN I: 0.04 ng/mL — AB (ref <0.031)

## 2015-12-24 LAB — COMPREHENSIVE METABOLIC PANEL
ALT: 20 U/L (ref 17–63)
ANION GAP: 18 — AB (ref 5–15)
AST: 22 U/L (ref 15–41)
Albumin: 3.4 g/dL — ABNORMAL LOW (ref 3.5–5.0)
Alkaline Phosphatase: 101 U/L (ref 38–126)
BUN: 18 mg/dL (ref 6–20)
CHLORIDE: 99 mmol/L — AB (ref 101–111)
CO2: 25 mmol/L (ref 22–32)
Calcium: 9.3 mg/dL (ref 8.9–10.3)
Creatinine, Ser: 1.11 mg/dL (ref 0.61–1.24)
GFR calc non Af Amer: 55 mL/min — ABNORMAL LOW (ref >60)
Glucose, Bld: 116 mg/dL — ABNORMAL HIGH (ref 65–99)
Potassium: 4.2 mmol/L (ref 3.5–5.1)
SODIUM: 142 mmol/L (ref 135–145)
Total Bilirubin: 1.7 mg/dL — ABNORMAL HIGH (ref 0.3–1.2)
Total Protein: 6.6 g/dL (ref 6.5–8.1)

## 2015-12-24 LAB — CBC WITH DIFFERENTIAL/PLATELET
BASOS PCT: 0 %
Basophils Absolute: 0 10*3/uL (ref 0.0–0.1)
EOS ABS: 0.1 10*3/uL (ref 0.0–0.7)
EOS PCT: 1 %
HCT: 39.5 % (ref 39.0–52.0)
HEMOGLOBIN: 12.4 g/dL — AB (ref 13.0–17.0)
LYMPHS ABS: 0.7 10*3/uL (ref 0.7–4.0)
Lymphocytes Relative: 8 %
MCH: 29.7 pg (ref 26.0–34.0)
MCHC: 31.4 g/dL (ref 30.0–36.0)
MCV: 94.5 fL (ref 78.0–100.0)
MONO ABS: 0.7 10*3/uL (ref 0.1–1.0)
MONOS PCT: 8 %
Neutro Abs: 7.3 10*3/uL (ref 1.7–7.7)
Neutrophils Relative %: 83 %
Platelets: 167 10*3/uL (ref 150–400)
RBC: 4.18 MIL/uL — ABNORMAL LOW (ref 4.22–5.81)
RDW: 14.7 % (ref 11.5–15.5)
WBC: 8.9 10*3/uL (ref 4.0–10.5)

## 2015-12-24 LAB — BRAIN NATRIURETIC PEPTIDE: B NATRIURETIC PEPTIDE 5: 29 pg/mL (ref 0.0–100.0)

## 2015-12-24 LAB — LIPASE, BLOOD: Lipase: 19 U/L (ref 11–51)

## 2015-12-24 LAB — LACTIC ACID, PLASMA
LACTIC ACID, VENOUS: 1.5 mmol/L (ref 0.5–2.0)
LACTIC ACID, VENOUS: 1.9 mmol/L (ref 0.5–2.0)

## 2015-12-24 MED ORDER — ENOXAPARIN SODIUM 40 MG/0.4ML ~~LOC~~ SOLN
40.0000 mg | SUBCUTANEOUS | Status: DC
Start: 2015-12-24 — End: 2015-12-31
  Administered 2015-12-24 – 2015-12-30 (×7): 40 mg via SUBCUTANEOUS
  Filled 2015-12-24 (×7): qty 0.4

## 2015-12-24 MED ORDER — DEXTROSE 5 % IV SOLN
1.0000 g | Freq: Once | INTRAVENOUS | Status: AC
  Administered 2015-12-24: 1 g via INTRAVENOUS
  Filled 2015-12-24: qty 1

## 2015-12-24 MED ORDER — SENNA 8.6 MG PO TABS
1.0000 | ORAL_TABLET | ORAL | Status: DC
  Administered 2015-12-25 – 2015-12-29 (×3): 8.6 mg via ORAL
  Filled 2015-12-24 (×6): qty 1

## 2015-12-24 MED ORDER — SODIUM CHLORIDE 0.9 % IV SOLN
1250.0000 mg | Freq: Once | INTRAVENOUS | Status: DC
  Administered 2015-12-24: 1250 mg via INTRAVENOUS
  Filled 2015-12-24: qty 1250

## 2015-12-24 MED ORDER — SODIUM CHLORIDE 0.9 % IV SOLN
INTRAVENOUS | Status: AC
  Administered 2015-12-24 – 2015-12-27 (×3): via INTRAVENOUS

## 2015-12-24 MED ORDER — ONDANSETRON HCL 4 MG/2ML IJ SOLN
4.0000 mg | INTRAMUSCULAR | Status: DC | PRN
Start: 2015-12-24 — End: 2015-12-24
  Administered 2015-12-24: 4 mg via INTRAVENOUS
  Filled 2015-12-24: qty 2

## 2015-12-24 MED ORDER — POLYETHYL GLYCOL-PROPYL GLYCOL 0.4-0.3 % OP GEL
1.0000 "application " | Freq: Every day | OPHTHALMIC | Status: DC
  Filled 2015-12-24: qty 10

## 2015-12-24 MED ORDER — SODIUM CHLORIDE 0.9 % IV SOLN
INTRAVENOUS | Status: DC
  Administered 2015-12-24: 75 mL/h via INTRAVENOUS

## 2015-12-24 MED ORDER — SODIUM CHLORIDE 0.9 % IV SOLN
INTRAVENOUS | Status: AC
  Administered 2015-12-24: 23:00:00 via INTRAVENOUS

## 2015-12-24 MED ORDER — PIPERACILLIN-TAZOBACTAM 3.375 G IVPB
3.3750 g | Freq: Three times a day (TID) | INTRAVENOUS | Status: DC
  Administered 2015-12-24 – 2015-12-30 (×17): 3.375 g via INTRAVENOUS
  Filled 2015-12-24 (×15): qty 50

## 2015-12-24 MED ORDER — BISACODYL 5 MG PO TBEC
5.0000 mg | DELAYED_RELEASE_TABLET | ORAL | Status: DC
  Administered 2015-12-25 – 2015-12-29 (×3): 5 mg via ORAL
  Filled 2015-12-24 (×6): qty 1

## 2015-12-24 MED ORDER — LATANOPROST 0.005 % OP SOLN
1.0000 [drp] | Freq: Every day | OPHTHALMIC | Status: DC
  Administered 2015-12-25 – 2015-12-30 (×6): 1 [drp] via OPHTHALMIC
  Filled 2015-12-24: qty 2.5

## 2015-12-24 MED ORDER — DOCUSATE SODIUM 50 MG PO CAPS
250.0000 mg | ORAL_CAPSULE | Freq: Two times a day (BID) | ORAL | Status: DC
  Administered 2015-12-24: 250 mg via ORAL
  Filled 2015-12-24 (×6): qty 1

## 2015-12-24 MED ORDER — ONDANSETRON HCL 4 MG/2ML IJ SOLN: 4.0000 mg | Freq: Three times a day (TID) | INTRAMUSCULAR | Status: AC | PRN

## 2015-12-24 MED ORDER — LATANOPROST 0.005 % OP SOLN
OPHTHALMIC | Status: AC
  Filled 2015-12-24: qty 2.5

## 2015-12-24 NOTE — Progress Notes (Signed)
ANTIBIOTIC CONSULT NOTE-Preliminary  Pharmacy Consult for Vancomycin Indication: pneumonia  No Known Allergies  Patient Measurements: Height: 5\' 7"  (170.2 cm) Weight: 150 lb (68.04 kg) IBW/kg (Calculated) : 66.1  Vital Signs: BP: 143/83 mmHg (01/27 2100) Pulse Rate: 79 (01/27 2100)  Labs:  Recent Labs  12/24/15 1759  WBC 8.9  HGB 12.4*  PLT 167  CREATININE 1.11    Estimated Creatinine Clearance: 38.9 mL/min (by C-G formula based on Cr of 1.11).  No results for input(s): VANCOTROUGH, VANCOPEAK, VANCORANDOM, GENTTROUGH, GENTPEAK, GENTRANDOM, TOBRATROUGH, TOBRAPEAK, TOBRARND, AMIKACINPEAK, AMIKACINTROU, AMIKACIN in the last 72 hours.   Microbiology: No results found for this or any previous visit (from the past 720 hour(s)).  Medical History: Past Medical History  Diagnosis Date  . HYPERLIPIDEMIA 07/09/2009  . BELLS PALSY 07/09/2009  . GLUCOMA 07/09/2009  . Essential hypertension, benign 07/09/2009  . HYPERTENSION 08/06/2009  . INGUINAL HERNIA, RIGHT 08/06/2009  . CONSTIPATION 08/06/2009  . URINARY INCONTINENCE 08/06/2009  . PSA, INCREASED 07/09/2009  . SKIN CANCER, HX OF 08/06/2009  . BENIGN PROSTATIC HYPERTROPHY, HX OF 07/09/2009  . PACEMAKER, PERMANENT 02/22/2010    Medtronic DDD  . Sinus node dysfunction    Anti-infectives    Start     Dose/Rate Route Frequency Ordered Stop   12/24/15 2130  vancomycin (VANCOCIN) 1,250 mg in sodium chloride 0.9 % 250 mL IVPB     1,250 mg 166.7 mL/hr over 90 Minutes Intravenous  Once 12/24/15 2124     12/24/15 2115  ceFEPIme (MAXIPIME) 1 g in dextrose 5 % 50 mL IVPB     1 g 100 mL/hr over 30 Minutes Intravenous  Once 12/24/15 2111       Assessment: 82 yo in ED.  Asked to initiate for pna.  Goal of Therapy:  Eradicate infection.  Plan:  Preliminary review of pertinent patient information completed.  Protocol will be initiated with a one-time dose(s) of Vancomycin 1250 mg.   Forestine Na clinical pharmacist will complete review  during morning rounds to assess patient and finalize treatment regimen.  Hart Robinsons A, RPH 12/24/2015,9:25 PM

## 2015-12-24 NOTE — ED Provider Notes (Signed)
CSN: [id number]     Arrival date & time 12/24/15  1713 History   First MD Initiated Contact with Patient 12/24/15 1739     Chief Complaint  Patient presents with  . Emesis     HPI  Pt was seen at 1745. Per pt and his family, c/o gradual onset and persistence of multiple intermittent episodes of N/V for the past 1 month. Pt's symptoms began after he stopped taken an oral chemo drug for his skin cancer 1 month ago. Pt states he was evaluated at [facility] for same 2 weeks ago, dx dehydration and admitted to the hospital for IVF. Pt's family states he has been taking zofran without improvement. Denies abd pain, no diarrhea, no CP/SOB, no back pain, no black or blood in stools or emesis, no fevers.    Past Medical History  Diagnosis Date  . HYPERLIPIDEMIA 07/09/2009  . BELLS PALSY 07/09/2009  . GLUCOMA 07/09/2009  . Essential hypertension, benign 07/09/2009  . HYPERTENSION 08/06/2009  . INGUINAL HERNIA, RIGHT 08/06/2009  . CONSTIPATION 08/06/2009  . URINARY INCONTINENCE 08/06/2009  . PSA, INCREASED 07/09/2009  . SKIN CANCER, HX OF 08/06/2009  . BENIGN PROSTATIC HYPERTROPHY, HX OF 07/09/2009  . PACEMAKER, PERMANENT 02/22/2010    [employer] DDD  . Sinus node dysfunction    Past Surgical History  Procedure Laterality Date  . Pacemaker placement  04/2006  . Abdominal aortic aneurysm repair  04/2006  . Cholecystectomy  04/2006  . Hernia repair  1997    Left inguinal  . Prostate surgery  2009    Cancer   Family History  Problem Relation Age of Onset  . Arthritis Other   . Cancer Other 52    breast  . Hypertension Other   . Heart disease Other   . Stroke Other    Social History  Substance Use Topics  . Smoking status: Former Smoker    Quit date: 11/27/1980  . Smokeless tobacco: Not on file  . Alcohol Use: No    Review of Systems ROS: Statement: All systems negative except as marked or noted in the HPI; Constitutional: Negative for fever and chills. ; ; Eyes: Negative for eye  pain, redness and discharge. ; ; ENMT: Negative for ear pain, hoarseness, nasal congestion, sinus pressure and sore throat. ; ; Cardiovascular: Negative for chest pain, palpitations, diaphoresis, dyspnea and peripheral edema. ; ; Respiratory: Negative for cough, wheezing and stridor. ; ; Gastrointestinal: +N/V. Negative for diarrhea, abdominal pain, blood in stool, hematemesis, jaundice and rectal bleeding. . ; ; Genitourinary: Negative for dysuria, flank pain and hematuria. ; ; Musculoskeletal: Negative for back pain and neck pain. Negative for swelling and trauma.; ; Skin: Negative for pruritus, rash, abrasions, blisters, bruising and skin lesion.; ; Neuro: Negative for headache, lightheadedness and neck stiffness. Negative for weakness, altered level of consciousness , altered mental status, extremity weakness, paresthesias, involuntary movement, seizure and syncope.      Allergies  Review of patient's allergies indicates no known allergies.  Home Medications   Prior to Admission medications   Medication Sig Start Date End Date Taking? Authorizing Provider  hydrochlorothiazide 25 MG tablet Take 25 mg by mouth daily.      Historical Provider, MD  latanoprost (XALATAN) 0.005 % ophthalmic solution Place 1 drop into both eyes at bedtime.  04/12/13   Historical Provider, MD  metoprolol (LOPRESSOR) 50 MG tablet Take 50 mg by mouth 2 (two) times daily.      Historical Provider, MD  Omega-3  Fatty Acids (FISH OIL) 1200 MG CAPS Take 1,200 mg by mouth 2 (two) times daily.     Historical Provider, MD  timolol (TIMOPTIC-XE) 0.25 % ophthalmic gel-forming Place 1 drop into both eyes daily.      Historical Provider, MD   BP 150/66 mmHg  Pulse 90  Resp 27  Ht 5\' 7"  (1.702 m)  Wt 150 lb (68.04 kg)  BMI 23.49 kg/m2  SpO2 93%   18:17:23 Orthostatic Vital Signs AC  Orthostatic Lying  - BP- Lying: 140/67 mmHg ; Pulse- Lying: 87  Orthostatic Sitting - BP- Sitting: 167/74 mmHg ; Pulse- Sitting: 101   Orthostatic Standing at 0 minutes - BP- Standing at 0 minutes: 145/63 mmHg ; Pulse- Standing at 0 minutes: 122       Physical Exam  1750: Physical examination:  Nursing notes reviewed; Vital signs and O2 SAT reviewed;  Constitutional: Well developed, Well nourished, In no acute distress; Head:  Normocephalic, atraumatic; Eyes: EOMI, PERRL, No scleral icterus; ENMT: Mouth and pharynx normal, Mucous membranes dry; Neck: Supple, Full range of motion, No lymphadenopathy; Cardiovascular: Regular rate and rhythm, No gallop; Respiratory: Breath sounds clear & equal bilaterally, No wheezes.  Speaking full sentences with ease, Normal respiratory effort/excursion; Chest: Nontender, Movement normal; Abdomen: Soft, Nontender, Nondistended, Normal bowel sounds; Genitourinary: No CVA tenderness; Extremities: Pulses normal, No tenderness, No edema, No calf edema or asymmetry.; Neuro: AA&Ox3, Major CN grossly intact.  Speech clear. No gross focal motor deficits in extremities.; Skin: Color normal, Warm, Dry.   ED Course  Procedures (including critical care time) Labs Review   Imaging Review  I have personally reviewed and evaluated these images and lab results as part of my medical decision-making.   EKG Interpretation   Date/Time:  Friday December 24 2015 17:43:51 EST Ventricular Rate:  102 PR Interval:  143 QRS Duration: 133 QT Interval:  387 QTC Calculation: 504 R Axis:   110 Text Interpretation:  Sinus tachycardia RBBB and LPFB Baseline wander When  compared with ECG of 05/02/2006 No significant change was found Confirmed by  [facility]  MD, Nunzio Cory [phone]) on 12/24/2015 6:18:09 PM      MDM  MDM Reviewed: previous chart, nursing note and vitals Reviewed previous: labs and ECG Interpretation: labs, ECG, x-ray and CT scan      Results for orders placed or performed during the hospital encounter of 12/24/15  Comprehensive metabolic panel  Result Value Ref Range   Sodium 142 135 - 145  mmol/L   Potassium 4.2 3.5 - 5.1 mmol/L   Chloride 99 (L) 101 - 111 mmol/L   CO2 25 22 - 32 mmol/L   Glucose, Bld 116 (H) 65 - 99 mg/dL   BUN 18 6 - 20 mg/dL   Creatinine, Ser 1.11 0.61 - 1.24 mg/dL   Calcium 9.3 8.9 - 10.3 mg/dL   Total Protein 6.6 6.5 - 8.1 g/dL   Albumin 3.4 (L) 3.5 - 5.0 g/dL   AST 22 15 - 41 U/L   ALT 20 17 - 63 U/L   Alkaline Phosphatase 101 38 - 126 U/L   Total Bilirubin 1.7 (H) 0.3 - 1.2 mg/dL   GFR calc non Af Amer 55 (L) >60 mL/min   GFR calc Af Amer >60 >60 mL/min   Anion gap 18 (H) 5 - 15  Lipase, blood  Result Value Ref Range   Lipase 19 11 - 51 U/L  Troponin I  Result Value Ref Range   Troponin I 0.04 (H) <0.031  ng/mL  Lactic acid, plasma  Result Value Ref Range   Lactic Acid, Venous 1.5 0.5 - 2.0 mmol/L  CBC with Differential  Result Value Ref Range   WBC 8.9 4.0 - 10.5 K/uL   RBC 4.18 (L) 4.22 - 5.81 MIL/uL   Hemoglobin 12.4 (L) 13.0 - 17.0 g/dL   HCT 39.5 39.0 - 52.0 %   MCV 94.5 78.0 - 100.0 fL   MCH 29.7 26.0 - 34.0 pg   MCHC 31.4 30.0 - 36.0 g/dL   RDW 14.7 11.5 - 15.5 %   Platelets 167 150 - 400 K/uL   Neutrophils Relative % 83 %   Neutro Abs 7.3 1.7 - 7.7 K/uL   Lymphocytes Relative 8 %   Lymphs Abs 0.7 0.7 - 4.0 K/uL   Monocytes Relative 8 %   Monocytes Absolute 0.7 0.1 - 1.0 K/uL   Eosinophils Relative 1 %   Eosinophils Absolute 0.1 0.0 - 0.7 K/uL   Basophils Relative 0 %   Basophils Absolute 0.0 0.0 - 0.1 K/uL   Ct Head Wo Contrast 12/24/2015  CLINICAL DATA:  Nausea and vomiting for 1 month with weakness. Initial encounter. EXAM: CT HEAD WITHOUT CONTRAST TECHNIQUE: Contiguous axial images were obtained from the base of the skull through the vertex without intravenous contrast. COMPARISON:  Brain MRI 05/01/2006.  Head CT scan 04/28/2006. FINDINGS: There is cortical atrophy and chronic microvascular ischemic change. No evidence of acute intracranial abnormality including hemorrhage, infarct, mass lesion, mass effect, midline  shift or abnormal extra-axial fluid collection. No hydrocephalus or pneumocephalus. The calvarium is intact. Imaged paranasal sinuses and mastoid air cells are clear. IMPRESSION: No acute abnormality. Atrophy and chronic microvascular ischemic change. Electronically Signed   By: Inge Rise M.D.   On: 12/24/2015 19:08   Dg Abd Acute W/chest 12/24/2015  CLINICAL DATA:  Nausea and vomiting for 2 weeks. EXAM: DG ABDOMEN ACUTE W/ 1V CHEST COMPARISON:  Chest x-ray from 12/10/2015. Abdomen film from 12/10/2015. FINDINGS: AP chest shows right base collapse/consolidation with moderate right pleural effusion. Left lung is clear. The cardiopericardial silhouette is within normal limits for size. Left-sided permanent pacemaker again noted. Upright imaging shows no evidence for intraperitoneal free air. Supine film shows diffuse gaseous distention of large and small bowel. Surgical clips in the right upper quadrant suggest prior cholecystectomy. Bones are diffusely demineralized. IMPRESSION: Increasing right base collapse/consolidation with fusion. Diffuse gaseous bowel distention. A component of ileus is suspected. Electronically Signed   By: Misty Stanley M.D.   On: 12/24/2015 19:42    2110:  New infiltrate on CXR; will tx for HCAP. Dx and testing d/w pt and family.  Questions answered.  Verb understanding, agreeable to admit. T/C to [facility]. Shanon Brow, case discussed, including:  HPI, pertinent PM/SHx, VS/PE, dx testing, ED course and treatment:  Agreeable to admit, requests to write temporary orders, obtain medical bed to team APAdmits.   Francine Graven, DO 12/26/15 1026

## 2015-12-24 NOTE — ED Notes (Signed)
Pt tolerating small sips of liquid

## 2015-12-24 NOTE — ED Notes (Signed)
Pt here for evaluation of nausea and vomiting for two weeks. Also, complaint of weakness

## 2015-12-24 NOTE — H&P (Signed)
PCP:   Eulas Post, MD   Chief Complaint:  Feeling awful. sob  HPI: 82 yo male lives at home alone has been having one month of postprandial nausea.  He thought it was due to his chemo pill he was on for his skin cancer so he stopped this but his nausea has persisted.  Nausea is worse after eating.  No diarrhea.  S/p chole.  He has felt bloated.  In the last ten days he has also had a significant change in his breathing.  His is very sob, has been having chills, does not know if he is running fever.  Has been having a productive cough which is nonbloody.  No swelling in his legs.  He was hospitalized at [facility] 2 weeks ago for the nausea and he was dehydrated, he was told everything looked fine but he was dehydrated he was given some ivf, no abx and sent home.  Pt has been given some ivf and oxygen [place] in the ED and he feels much better.  His sob is better.  Found to have pna and ileus.  Referred for admission for both issues.  Pt denies any chest pain or abdominal pain.  Review of Systems:  Positive and negative as per HPI otherwise all other systems are negative  Past Medical History: Past Medical History  Diagnosis Date  . HYPERLIPIDEMIA 07/09/2009  . BELLS PALSY 07/09/2009  . GLUCOMA 07/09/2009  . Essential hypertension, benign 07/09/2009  . HYPERTENSION 08/06/2009  . INGUINAL HERNIA, RIGHT 08/06/2009  . CONSTIPATION 08/06/2009  . URINARY INCONTINENCE 08/06/2009  . PSA, INCREASED 07/09/2009  . SKIN CANCER, HX OF 08/06/2009  . BENIGN PROSTATIC HYPERTROPHY, HX OF 07/09/2009  . PACEMAKER, PERMANENT 02/22/2010    [employer] DDD  . Sinus node dysfunction    Past Surgical History  Procedure Laterality Date  . Pacemaker placement  04/2006  . Abdominal aortic aneurysm repair  04/2006  . Cholecystectomy  04/2006  . Hernia repair  1997    Left inguinal  . Prostate surgery  2009    Cancer    Medications: Prior to Admission medications   Medication Sig Start Date End Date Taking? Authorizing  Provider  bisacodyl (DULCOLAX) 5 MG EC tablet Take 5 mg by mouth every other day.   Yes Historical Provider, MD  docusate sodium (COLACE) 250 MG capsule Take 250 mg by mouth 2 (two) times daily.   Yes Historical Provider, MD  hydrochlorothiazide 25 MG tablet Take 25 mg by mouth daily.     Yes Historical Provider, MD  latanoprost (XALATAN) 0.005 % ophthalmic solution Place 1 drop into both eyes at bedtime.  04/12/13  Yes Historical Provider, MD  metoprolol (LOPRESSOR) 50 MG tablet Take 50 mg by mouth 2 (two) times daily.     Yes Historical Provider, MD  Multiple Vitamins-Minerals (ICAPS MV PO) Take 1 tablet by mouth 2 (two) times daily.   Yes Historical Provider, MD  Omega-3 Fatty Acids (FISH OIL PO) Take 1 capsule by mouth daily.   Yes Historical Provider, MD  ondansetron (ZOFRAN) 4 MG tablet Take 4 mg by mouth 2 (two) times daily.   Yes Historical Provider, MD  Polyethyl Glycol-Propyl Glycol (SYSTANE) 0.4-0.3 % GEL ophthalmic gel Place 1 application into both eyes daily.   Yes Historical Provider, MD  senna (SENOKOT) 8.6 MG TABS tablet Take 1 tablet by mouth every other day.   Yes Historical Provider, MD  ERIVEDGE 150 MG capsule Take 150 mg by mouth daily. 11/17/15  Historical Provider, MD    Allergies:  No Known Allergies  Social History:  reports that he quit smoking about 35 years ago. He does not have any smokeless tobacco history on file. He reports that he does not drink alcohol. His drug history is not on file.  Family History: Family History  Problem Relation Age of Onset  . Arthritis Other   . Cancer Other 52    breast  . Hypertension Other   . Heart disease Other   . Stroke Other     Physical Exam: Filed Vitals:   12/24/15 1800 12/24/15 2000 12/24/15 2100 12/24/15 2130  BP: 150/66 133/71 143/83 152/56  Pulse: 90 84 79 80  Resp: 27 22 21 22   Height:      Weight:      SpO2: 93% 95% 93% 92%   General appearance: alert, cooperative and no distress Head: Normocephalic,  without obvious abnormality, atraumatic Eyes: negative Nose: Nares normal. Septum midline. Mucosa normal. No drainage or sinus tenderness. Neck: no JVD and supple, symmetrical, trachea midline Lungs: clear to auscultation bilaterally diminished at right base Heart: regular rate and rhythm, S1, S2 normal, no murmur, click, rub or gallop Abdomen: soft, non-tender; bowel sounds normal; no masses,  no organomegaly Extremities: extremities normal, atraumatic, no cyanosis or edema Pulses: 2+ and symmetric Skin: Skin color, texture, turgor normal. No rashes or lesions Neurologic: Grossly normal    Labs on Admission:   Recent Labs  12/24/15 1759  NA 142  K 4.2  CL 99*  CO2 25  GLUCOSE 116*  BUN 18  CREATININE 1.11  CALCIUM 9.3    Recent Labs  12/24/15 1759  AST 22  ALT 20  ALKPHOS 101  BILITOT 1.7*  PROT 6.6  ALBUMIN 3.4*    Recent Labs  12/24/15 1759  LIPASE 19    Recent Labs  12/24/15 1759  WBC 8.9  NEUTROABS 7.3  HGB 12.4*  HCT 39.5  MCV 94.5  PLT 167    Recent Labs  12/24/15 1759  TROPONINI 0.04*   Radiological Exams on Admission: Ct Head Wo Contrast  12/24/2015  CLINICAL DATA:  Nausea and vomiting for 1 month with weakness. Initial encounter. EXAM: CT HEAD WITHOUT CONTRAST TECHNIQUE: Contiguous axial images were obtained from the base of the skull through the vertex without intravenous contrast. COMPARISON:  Brain MRI 05/01/2006.  Head CT scan 04/28/2006. FINDINGS: There is cortical atrophy and chronic microvascular ischemic change. No evidence of acute intracranial abnormality including hemorrhage, infarct, mass lesion, mass effect, midline shift or abnormal extra-axial fluid collection. No hydrocephalus or pneumocephalus. The calvarium is intact. Imaged paranasal sinuses and mastoid air cells are clear. IMPRESSION: No acute abnormality. Atrophy and chronic microvascular ischemic change. Electronically Signed   By: Inge Rise M.D.   On: 12/24/2015  19:08   Dg Abd Acute W/chest  12/24/2015  CLINICAL DATA:  Nausea and vomiting for 2 weeks. EXAM: DG ABDOMEN ACUTE W/ 1V CHEST COMPARISON:  Chest x-ray from 12/10/2015. Abdomen film from 12/10/2015. FINDINGS: AP chest shows right base collapse/consolidation with moderate right pleural effusion. Left lung is clear. The cardiopericardial silhouette is within normal limits for size. Left-sided permanent pacemaker again noted. Upright imaging shows no evidence for intraperitoneal free air. Supine film shows diffuse gaseous distention of large and small bowel. Surgical clips in the right upper quadrant suggest prior cholecystectomy. Bones are diffusely demineralized. IMPRESSION: Increasing right base collapse/consolidation with fusion. Diffuse gaseous bowel distention. A component of ileus is suspected. Electronically Signed  By: Misty Stanley M.D.   On: 12/24/2015 19:42    Assessment/Plan  82 yo male with HCAP and nausea for over a month  Principal Problem:   HCAP (healthcare-associated pneumonia)- with associated pleural effusion.  Recent hospitalized at [facility].  Will place on vanco and zosyn iv.  pna pathway.  Blood and sputum cultures ordered.  Provide supplemental oxygen.  No respiratory distress at this time.    Active Problems:   Essential hypertension- noted   PACEMAKER, PERMANENT-[employer]- noted   CKD (chronic kidney disease) stage 3, GFR 30-59 ml/min- at baseline   Nausea & vomiting- not sure if his nausea is really associated with his pna since that has been going on for over a month.  Treat pna and underlying ileus and reassess for need for further work up.  abd exam is benign.  Labs unremarkable, s/p chole.  LA nml so doubt underlying ishemia.     Elevated troponin- serial trop overnight, check bnp   Pleural effusion on right- may need thoracentesis depending on how he responds to abx  possible Ileus ([place])- npo, conservative treatment.  abd exam is benign  Pt wishes to be DNR,  discussed with his eldest son in room.  Admit to tele.    DAVID,RACHAL A 12/24/2015, 9:57 PM

## 2015-12-25 DIAGNOSIS — E870 Hyperosmolality and hypernatremia: Secondary | ICD-10-CM | POA: Diagnosis not present

## 2015-12-25 DIAGNOSIS — T451X5A Adverse effect of antineoplastic and immunosuppressive drugs, initial encounter: Secondary | ICD-10-CM | POA: Diagnosis present

## 2015-12-25 DIAGNOSIS — Z950 Presence of cardiac pacemaker: Secondary | ICD-10-CM | POA: Diagnosis not present

## 2015-12-25 DIAGNOSIS — K567 Ileus, unspecified: Secondary | ICD-10-CM | POA: Diagnosis present

## 2015-12-25 DIAGNOSIS — Y95 Nosocomial condition: Secondary | ICD-10-CM | POA: Diagnosis present

## 2015-12-25 DIAGNOSIS — R4702 Dysphasia: Secondary | ICD-10-CM | POA: Diagnosis present

## 2015-12-25 DIAGNOSIS — R0602 Shortness of breath: Secondary | ICD-10-CM | POA: Diagnosis present

## 2015-12-25 DIAGNOSIS — C4490 Unspecified malignant neoplasm of skin, unspecified: Secondary | ICD-10-CM | POA: Diagnosis present

## 2015-12-25 DIAGNOSIS — J189 Pneumonia, unspecified organism: Secondary | ICD-10-CM | POA: Diagnosis present

## 2015-12-25 DIAGNOSIS — E785 Hyperlipidemia, unspecified: Secondary | ICD-10-CM | POA: Diagnosis present

## 2015-12-25 DIAGNOSIS — I129 Hypertensive chronic kidney disease with stage 1 through stage 4 chronic kidney disease, or unspecified chronic kidney disease: Secondary | ICD-10-CM | POA: Diagnosis present

## 2015-12-25 DIAGNOSIS — Z66 Do not resuscitate: Secondary | ICD-10-CM | POA: Diagnosis present

## 2015-12-25 DIAGNOSIS — I272 Other secondary pulmonary hypertension: Secondary | ICD-10-CM | POA: Diagnosis present

## 2015-12-25 DIAGNOSIS — N183 Chronic kidney disease, stage 3 (moderate): Secondary | ICD-10-CM | POA: Diagnosis present

## 2015-12-25 DIAGNOSIS — Z8249 Family history of ischemic heart disease and other diseases of the circulatory system: Secondary | ICD-10-CM | POA: Diagnosis not present

## 2015-12-25 DIAGNOSIS — R112 Nausea with vomiting, unspecified: Secondary | ICD-10-CM | POA: Diagnosis present

## 2015-12-25 DIAGNOSIS — R1310 Dysphagia, unspecified: Secondary | ICD-10-CM | POA: Diagnosis not present

## 2015-12-25 DIAGNOSIS — J90 Pleural effusion, not elsewhere classified: Secondary | ICD-10-CM | POA: Diagnosis present

## 2015-12-25 DIAGNOSIS — Z823 Family history of stroke: Secondary | ICD-10-CM | POA: Diagnosis not present

## 2015-12-25 DIAGNOSIS — Z87891 Personal history of nicotine dependence: Secondary | ICD-10-CM | POA: Diagnosis not present

## 2015-12-25 DIAGNOSIS — R1110 Vomiting, unspecified: Secondary | ICD-10-CM | POA: Diagnosis not present

## 2015-12-25 DIAGNOSIS — I248 Other forms of acute ischemic heart disease: Secondary | ICD-10-CM | POA: Diagnosis present

## 2015-12-25 DIAGNOSIS — E876 Hypokalemia: Secondary | ICD-10-CM | POA: Diagnosis not present

## 2015-12-25 DIAGNOSIS — Z9049 Acquired absence of other specified parts of digestive tract: Secondary | ICD-10-CM | POA: Diagnosis not present

## 2015-12-25 DIAGNOSIS — R7989 Other specified abnormal findings of blood chemistry: Secondary | ICD-10-CM | POA: Diagnosis not present

## 2015-12-25 DIAGNOSIS — I10 Essential (primary) hypertension: Secondary | ICD-10-CM | POA: Diagnosis not present

## 2015-12-25 DIAGNOSIS — Z515 Encounter for palliative care: Secondary | ICD-10-CM | POA: Diagnosis present

## 2015-12-25 LAB — URINALYSIS, ROUTINE W REFLEX MICROSCOPIC
GLUCOSE, UA: NEGATIVE mg/dL
HGB URINE DIPSTICK: NEGATIVE
Ketones, ur: 40 mg/dL — AB
Leukocytes, UA: NEGATIVE
Nitrite: NEGATIVE
Protein, ur: NEGATIVE mg/dL
SPECIFIC GRAVITY, URINE: 1.025 (ref 1.005–1.030)
pH: 5.5 (ref 5.0–8.0)

## 2015-12-25 LAB — TROPONIN I
TROPONIN I: 0.05 ng/mL — AB (ref <0.031)
Troponin I: 0.05 ng/mL — ABNORMAL HIGH (ref <0.031)

## 2015-12-25 LAB — STREP PNEUMONIAE URINARY ANTIGEN: STREP PNEUMO URINARY ANTIGEN: NEGATIVE

## 2015-12-25 MED ORDER — VANCOMYCIN HCL IN DEXTROSE 1-5 GM/200ML-% IV SOLN
1000.0000 mg | INTRAVENOUS | Status: DC
  Administered 2015-12-25: 1000 mg via INTRAVENOUS

## 2015-12-25 MED ORDER — DOCUSATE SODIUM 100 MG PO CAPS
200.0000 mg | ORAL_CAPSULE | Freq: Two times a day (BID) | ORAL | Status: DC
  Administered 2015-12-25 – 2015-12-29 (×9): 200 mg via ORAL
  Filled 2015-12-25 (×10): qty 2

## 2015-12-25 MED ORDER — POLYETHYL GLYCOL-PROPYL GLYCOL 0.4-0.3 % OP GEL
1.0000 "application " | Freq: Every day | OPHTHALMIC | Status: DC
  Administered 2015-12-28 – 2015-12-31 (×4): 1 via OPHTHALMIC
  Filled 2015-12-25: qty 10

## 2015-12-25 NOTE — Progress Notes (Signed)
ANTIBIOTIC CONSULT NOTE- follow up  Pharmacy Consult for Vancomycin and Zosyn Indication: pneumonia / sepsis  No Known Allergies  Patient Measurements: Height: 5\' 7"  (170.2 cm) Weight: 161 lb 1.6 oz (73.074 kg) IBW/kg (Calculated) : 66.1  Vital Signs: Temp: 98.2 F (36.8 C) (01/28 0653) Temp Source: Oral (01/28 0653) BP: 144/49 mmHg (01/28 0653) Pulse Rate: 76 (01/28 0653)  Labs:  Recent Labs  12/24/15 1759  WBC 8.9  HGB 12.4*  PLT 167  CREATININE 1.11    Estimated Creatinine Clearance: 38.9 mL/min (by C-G formula based on Cr of 1.11).  No results for input(s): VANCOTROUGH, VANCOPEAK, VANCORANDOM, GENTTROUGH, GENTPEAK, GENTRANDOM, TOBRATROUGH, TOBRAPEAK, TOBRARND, AMIKACINPEAK, AMIKACINTROU, AMIKACIN in the last 72 hours.   Microbiology: Recent Results (from the past 720 hour(s))  Culture, blood (routine x 2) Call MD if unable to obtain prior to antibiotics being given     Status: None (Preliminary result)   Collection Time: 12/24/15 10:27 PM  Result Value Ref Range Status   Specimen Description RIGHT ANTECUBITAL  Final   Special Requests BOTTLES DRAWN AEROBIC ONLY 4CC ONLY  Final   Culture PENDING  Incomplete   Report Status PENDING  Incomplete  Culture, blood (routine x 2) Call MD if unable to obtain prior to antibiotics being given     Status: None (Preliminary result)   Collection Time: 12/24/15 10:36 PM  Result Value Ref Range Status   Specimen Description RIGHT ANTECUBITAL  Final   Special Requests BOTTLES DRAWN AEROBIC ONLY 4CC ONLY  Final   Culture PENDING  Incomplete   Report Status PENDING  Incomplete    Medical History: Past Medical History  Diagnosis Date  . HYPERLIPIDEMIA 07/09/2009  . BELLS PALSY 07/09/2009  . GLUCOMA 07/09/2009  . Essential hypertension, benign 07/09/2009  . HYPERTENSION 08/06/2009  . INGUINAL HERNIA, RIGHT 08/06/2009  . CONSTIPATION 08/06/2009  . URINARY INCONTINENCE 08/06/2009  . PSA, INCREASED 07/09/2009  . SKIN CANCER, HX OF  08/06/2009  . BENIGN PROSTATIC HYPERTROPHY, HX OF 07/09/2009  . PACEMAKER, PERMANENT 02/22/2010    [employer] DDD  . Sinus node dysfunction (HCC)    Anti-infectives    Start     Dose/Rate Route Frequency Ordered Stop   12/25/15 1800  vancomycin (VANCOCIN) IVPB 1000 mg/200 mL premix     1,000 mg 200 mL/hr over 60 Minutes Intravenous Every 24 hours 12/25/15 0834     12/24/15 2245  piperacillin-tazobactam (ZOSYN) IVPB 3.375 g     3.375 g 12.5 mL/hr over 240 Minutes Intravenous Every 8 hours 12/24/15 2243     12/24/15 2130  vancomycin (VANCOCIN) 1,250 mg in sodium chloride 0.9 % 250 mL IVPB  Status:  Discontinued     1,250 mg 166.7 mL/hr over 90 Minutes Intravenous  Once 12/24/15 2124 12/24/15 2221   12/24/15 2115  ceFEPIme (MAXIPIME) 1 g in dextrose 5 % 50 mL IVPB     1 g 100 mL/hr over 30 Minutes Intravenous  Once 12/24/15 2111 12/24/15 2210     Assessment: 82 yo admitted via ED.  Asked to initiate Vancomycin and Zosyn for pna.  WBC OK, afebrile.   Goal of Therapy:  Eradicate infection. Vancomycin trough level 15-20  Plan:  Vancomycin 1000mg  IV q24hrs Check trough at steady state Zosyn 3.375gm IV q8h, EID (for clcr > 20) Monitor labs, renal fxn, progress and c/s  Hart Robinsons A, RPH 12/25/2015,8:34 AM

## 2015-12-25 NOTE — Progress Notes (Signed)
[facility] PROGRESS NOTE  Shane Simpson [id number] DOB: 28-May-1920 DOA: 12/24/2015 PCP: Eulas Post, MD  Assessment/Plan: Hospital-acquired pneumonia -He feels symptomatically improved, although still weak. -We'll continue vancomycin and Zosyn today. We'll check MRSA PCR, if negative can consider discontinuing vancomycin. -Check strep pneumo and legionella urinary antigens.  Elevated troponins -Likely represent demand ischemia due to pneumonia. -No EKG changes. -Check echocardiogram to make sure EF is preserved.  Ileus -Patient is not complaining of abdominal pain, has not had any nausea or vomiting and is requesting food. Will advance diet.  Chronic kidney disease stage III -At baseline  Code Status: DO NOT RESUSCITATE Family Communication: Son at bedside updated on plan of care  Disposition Plan: To be determined   Consultants:  None   Antibiotics:  Vancomycin  Zosyn   Subjective: Feels very weak, shortness of breath has improved, describes no vomiting or abdominal pain  Objective: Filed Vitals:   12/24/15 2130 12/24/15 2200 12/24/15 2256 12/25/15 0653  BP: 152/56 153/55 159/53 144/49  Pulse: 80 83 87 76  Temp:   98.7 F (37.1 C) 98.2 F (36.8 C)  TempSrc:   Oral Oral  Resp: 22 24 20 20   Height:   5\' 7"  (1.702 m)   Weight:   73.074 kg (161 lb 1.6 oz)   SpO2: 92% 91% 94% 96%    Intake/Output Summary (Last 24 hours) at 12/25/15 1430 Last data filed at 12/24/15 1731  Gross per 24 hour  Intake    500 ml  Output      0 ml  Net    500 ml   Filed Weights   12/24/15 1719 12/24/15 2256  Weight: 68.04 kg (150 lb) 73.074 kg (161 lb 1.6 oz)    Exam:   General:  Alert, awake, oriented 3  Cardiovascular: Regular rate and rhythm  Respiratory: Coarse bilateral breath sounds, crackles over the right lung fields and decreased breath sounds at right base  Abdomen: Soft, nontender, nondistended, positive bowel sounds  Extremities:  No clubbing, cyanosis or edema, positive pulses   Neurologic:  Grossly intact and nonfocal, had not ambulated him  Data Reviewed: Basic Metabolic Panel:  Recent Labs Lab 12/24/15 1759  NA 142  K 4.2  CL 99*  CO2 25  GLUCOSE 116*  BUN 18  CREATININE 1.11  CALCIUM 9.3   Liver Function Tests:  Recent Labs Lab 12/24/15 1759  AST 22  ALT 20  ALKPHOS 101  BILITOT 1.7*  PROT 6.6  ALBUMIN 3.4*    Recent Labs Lab 12/24/15 1759  LIPASE 19   No results for input(s): AMMONIA in the last 168 hours. CBC:  Recent Labs Lab 12/24/15 1759  WBC 8.9  NEUTROABS 7.3  HGB 12.4*  HCT 39.5  MCV 94.5  PLT 167   Cardiac Enzymes:  Recent Labs Lab 12/24/15 1759 12/24/15 2236 12/25/15 0353 12/25/15 1035  TROPONINI 0.04* 0.04* 0.05* 0.05*   BNP (last 3 results)  Recent Labs  12/24/15 2236  BNP 29.0    ProBNP (last 3 results) No results for input(s): PROBNP in the last 8760 hours.  CBG: No results for input(s): GLUCAP in the last 168 hours.  Recent Results (from the past 240 hour(s))  Culture, blood (routine x 2) Call MD if unable to obtain prior to antibiotics being given     Status: None (Preliminary result)   Collection Time: 12/24/15 10:27 PM  Result Value Ref Range Status   Specimen Description RIGHT ANTECUBITAL  Final  Special Requests BOTTLES DRAWN AEROBIC ONLY 4CC ONLY  Final   Culture NO GROWTH < 12 HOURS  Final   Report Status PENDING  Incomplete  Culture, blood (routine x 2) Call MD if unable to obtain prior to antibiotics being given     Status: None (Preliminary result)   Collection Time: 12/24/15 10:36 PM  Result Value Ref Range Status   Specimen Description RIGHT ANTECUBITAL  Final   Special Requests BOTTLES DRAWN AEROBIC ONLY 4CC ONLY  Final   Culture NO GROWTH < 12 HOURS  Final   Report Status PENDING  Incomplete     Studies: Ct Head Wo Contrast  12/24/2015  CLINICAL DATA:  Nausea and vomiting for 1 month with weakness. Initial encounter.  EXAM: CT HEAD WITHOUT CONTRAST TECHNIQUE: Contiguous axial images were obtained from the base of the skull through the vertex without intravenous contrast. COMPARISON:  Brain MRI 05/01/2006.  Head CT scan 04/28/2006. FINDINGS: There is cortical atrophy and chronic microvascular ischemic change. No evidence of acute intracranial abnormality including hemorrhage, infarct, mass lesion, mass effect, midline shift or abnormal extra-axial fluid collection. No hydrocephalus or pneumocephalus. The calvarium is intact. Imaged paranasal sinuses and mastoid air cells are clear. IMPRESSION: No acute abnormality. Atrophy and chronic microvascular ischemic change. Electronically Signed   By: Inge Rise M.D.   On: 12/24/2015 19:08   Dg Abd Acute W/chest  12/24/2015  CLINICAL DATA:  Nausea and vomiting for 2 weeks. EXAM: DG ABDOMEN ACUTE W/ 1V CHEST COMPARISON:  Chest x-ray from 12/10/2015. Abdomen film from 12/10/2015. FINDINGS: AP chest shows right base collapse/consolidation with moderate right pleural effusion. Left lung is clear. The cardiopericardial silhouette is within normal limits for size. Left-sided permanent pacemaker again noted. Upright imaging shows no evidence for intraperitoneal free air. Supine film shows diffuse gaseous distention of large and small bowel. Surgical clips in the right upper quadrant suggest prior cholecystectomy. Bones are diffusely demineralized. IMPRESSION: Increasing right base collapse/consolidation with fusion. Diffuse gaseous bowel distention. A component of ileus is suspected. Electronically Signed   By: Misty Stanley M.D.   On: 12/24/2015 19:42    Scheduled Meds: . bisacodyl  5 mg Oral QODAY  . docusate sodium  200 mg Oral BID  . enoxaparin (LOVENOX) injection  40 mg Subcutaneous Q24H  . latanoprost  1 drop Both Eyes QHS  . piperacillin-tazobactam (ZOSYN)  IV  3.375 g Intravenous Q8H  . [START ON 12/26/2015] Polyethyl Glycol-Propyl Glycol  1 application Both Eyes Daily    . senna  1 tablet Oral QODAY  . vancomycin  1,000 mg Intravenous Q24H   Continuous Infusions:   Principal Problem:   HCAP (healthcare-associated pneumonia) Active Problems:   Essential hypertension   PACEMAKER, PERMANENT-[employer]   CKD (chronic kidney disease) stage 3, GFR 30-59 ml/min   Nausea & vomiting   Elevated troponin   Pleural effusion on right   Ileus (HCC)    Time spent: 25 minutes. Greater than 50% of this time was spent in direct contact with the patient coordinating care.    Lelon Frohlich  [facility] Pager [phone]  If 7PM-7AM, please contact night-coverage at [email or web address], password [email or web address] 12/25/2015, 2:30 PM

## 2015-12-26 ENCOUNTER — Inpatient Hospital Stay (HOSPITAL_COMMUNITY): Payer: Medicare Other

## 2015-12-26 LAB — MRSA PCR SCREENING: MRSA by PCR: NEGATIVE

## 2015-12-26 LAB — STREP PNEUMONIAE URINARY ANTIGEN: STREP PNEUMO URINARY ANTIGEN: NEGATIVE

## 2015-12-26 NOTE — Progress Notes (Signed)
  Echocardiogram 2D Echocardiogram has been performed.  Aggie Cosier 12/26/2015, 1:55 PM

## 2015-12-26 NOTE — Progress Notes (Signed)
[facility] PROGRESS NOTE  Shane Simpson [id number] DOB: Aug 15, 1920 DOA: 12/24/2015 PCP: Eulas Post, MD  Assessment/Plan: Hospital-acquired pneumonia -He feels symptomatically improved, although still weak. -I will discontinue vancomycin given negative PCR and continue Zosyn. -Check strep pneumo and legionella urinary antigens.  Elevated troponins -Likely represents demand ischemia due to pneumonia. -No EKG changes. -Check echocardiogram to make sure EF is preserved (pending).  Ileus -Patient is not complaining of abdominal pain, has not had any nausea or vomiting and is requesting food. Continue clear liquids per patient request.  Chronic kidney disease stage III -At baseline  Code Status: DO NOT RESUSCITATE Family Communication: Son at bedside updated on plan of care  Disposition Plan: To be determined   Consultants:  None   Antibiotics:  Vancomycin  Zosyn   Subjective: Feels very weak, shortness of breath has improved, describes no vomiting or abdominal pain  Objective: Filed Vitals:   12/25/15 1705 12/25/15 1953 12/25/15 2304 12/26/15 0418  BP:   155/43 158/60  Pulse:   69   Temp:   98.4 F (36.9 C) 98 F (36.7 C)  TempSrc:   Oral Oral  Resp:   20   Height:      Weight:      SpO2: 89% 94% 98% 97%    Intake/Output Summary (Last 24 hours) at 12/26/15 1301 Last data filed at 12/26/15 0800  Gross per 24 hour  Intake    120 ml  Output    900 ml  Net   -780 ml   Filed Weights   12/24/15 1719 12/24/15 2256  Weight: 68.04 kg (150 lb) 73.074 kg (161 lb 1.6 oz)    Exam:   General:  Alert, awake, oriented 3  Cardiovascular: Regular rate and rhythm  Respiratory: Coarse bilateral breath sounds, crackles over the right lung fields and decreased breath sounds at right base  Abdomen: Soft, nontender, nondistended, positive bowel sounds  Extremities: No clubbing, cyanosis or edema, positive pulses   Neurologic:  Grossly  intact and nonfocal, had not ambulated him  Data Reviewed: Basic Metabolic Panel:  Recent Labs Lab 12/24/15 1759  NA 142  K 4.2  CL 99*  CO2 25  GLUCOSE 116*  BUN 18  CREATININE 1.11  CALCIUM 9.3   Liver Function Tests:  Recent Labs Lab 12/24/15 1759  AST 22  ALT 20  ALKPHOS 101  BILITOT 1.7*  PROT 6.6  ALBUMIN 3.4*    Recent Labs Lab 12/24/15 1759  LIPASE 19   No results for input(s): AMMONIA in the last 168 hours. CBC:  Recent Labs Lab 12/24/15 1759  WBC 8.9  NEUTROABS 7.3  HGB 12.4*  HCT 39.5  MCV 94.5  PLT 167   Cardiac Enzymes:  Recent Labs Lab 12/24/15 1759 12/24/15 2236 12/25/15 0353 12/25/15 1035  TROPONINI 0.04* 0.04* 0.05* 0.05*   BNP (last 3 results)  Recent Labs  12/24/15 2236  BNP 29.0    ProBNP (last 3 results) No results for input(s): PROBNP in the last 8760 hours.  CBG: No results for input(s): GLUCAP in the last 168 hours.  Recent Results (from the past 240 hour(s))  Culture, blood (routine x 2) Call MD if unable to obtain prior to antibiotics being given     Status: None (Preliminary result)   Collection Time: 12/24/15 10:27 PM  Result Value Ref Range Status   Specimen Description RIGHT ANTECUBITAL  Final   Special Requests BOTTLES DRAWN AEROBIC ONLY 4CC ONLY  Final  Culture NO GROWTH < 12 HOURS  Final   Report Status PENDING  Incomplete  Culture, blood (routine x 2) Call MD if unable to obtain prior to antibiotics being given     Status: None (Preliminary result)   Collection Time: 12/24/15 10:36 PM  Result Value Ref Range Status   Specimen Description RIGHT ANTECUBITAL  Final   Special Requests BOTTLES DRAWN AEROBIC ONLY 4CC ONLY  Final   Culture NO GROWTH < 12 HOURS  Final   Report Status PENDING  Incomplete  MRSA PCR Screening     Status: None   Collection Time: 12/26/15  8:30 AM  Result Value Ref Range Status   MRSA by PCR NEGATIVE NEGATIVE Final    Comment:        The GeneXpert MRSA Assay  (FDA approved for NASAL specimens only), is one component of a comprehensive MRSA colonization surveillance program. It is not intended to diagnose MRSA infection nor to guide or monitor treatment for MRSA infections.      Studies: Ct Head Wo Contrast  12/24/2015  CLINICAL DATA:  Nausea and vomiting for 1 month with weakness. Initial encounter. EXAM: CT HEAD WITHOUT CONTRAST TECHNIQUE: Contiguous axial images were obtained from the base of the skull through the vertex without intravenous contrast. COMPARISON:  Brain MRI 05/01/2006.  Head CT scan 04/28/2006. FINDINGS: There is cortical atrophy and chronic microvascular ischemic change. No evidence of acute intracranial abnormality including hemorrhage, infarct, mass lesion, mass effect, midline shift or abnormal extra-axial fluid collection. No hydrocephalus or pneumocephalus. The calvarium is intact. Imaged paranasal sinuses and mastoid air cells are clear. IMPRESSION: No acute abnormality. Atrophy and chronic microvascular ischemic change. Electronically Signed   By: Inge Rise M.D.   On: 12/24/2015 19:08   Dg Abd Acute W/chest  12/24/2015  CLINICAL DATA:  Nausea and vomiting for 2 weeks. EXAM: DG ABDOMEN ACUTE W/ 1V CHEST COMPARISON:  Chest x-ray from 12/10/2015. Abdomen film from 12/10/2015. FINDINGS: AP chest shows right base collapse/consolidation with moderate right pleural effusion. Left lung is clear. The cardiopericardial silhouette is within normal limits for size. Left-sided permanent pacemaker again noted. Upright imaging shows no evidence for intraperitoneal free air. Supine film shows diffuse gaseous distention of large and small bowel. Surgical clips in the right upper quadrant suggest prior cholecystectomy. Bones are diffusely demineralized. IMPRESSION: Increasing right base collapse/consolidation with fusion. Diffuse gaseous bowel distention. A component of ileus is suspected. Electronically Signed   By: Misty Stanley M.D.    On: 12/24/2015 19:42    Scheduled Meds: . bisacodyl  5 mg Oral QODAY  . docusate sodium  200 mg Oral BID  . enoxaparin (LOVENOX) injection  40 mg Subcutaneous Q24H  . latanoprost  1 drop Both Eyes QHS  . piperacillin-tazobactam (ZOSYN)  IV  3.375 g Intravenous Q8H  . Polyethyl Glycol-Propyl Glycol  1 application Both Eyes Daily  . senna  1 tablet Oral QODAY  . vancomycin  1,000 mg Intravenous Q24H   Continuous Infusions:   Principal Problem:   HCAP (healthcare-associated pneumonia) Active Problems:   Essential hypertension   PACEMAKER, PERMANENT-[employer]   CKD (chronic kidney disease) stage 3, GFR 30-59 ml/min   Nausea & vomiting   Elevated troponin   Pleural effusion on right   Ileus (HCC)    Time spent: 25 minutes. Greater than 50% of this time was spent in direct contact with the patient coordinating care.    Lelon Frohlich  [facility] Pager [phone]  If 7PM-7AM, please  contact night-coverage at [email or web address], password [facility] 12/26/2015, 1:01 PM  LOS: 1 day

## 2015-12-27 ENCOUNTER — Encounter (HOSPITAL_COMMUNITY): Payer: Self-pay | Admitting: Primary Care

## 2015-12-27 DIAGNOSIS — Z515 Encounter for palliative care: Secondary | ICD-10-CM | POA: Diagnosis not present

## 2015-12-27 LAB — BASIC METABOLIC PANEL
Anion gap: 13 (ref 5–15)
BUN: 12 mg/dL (ref 6–20)
CALCIUM: 8.9 mg/dL (ref 8.9–10.3)
CO2: 30 mmol/L (ref 22–32)
CREATININE: 1.02 mg/dL (ref 0.61–1.24)
Chloride: 100 mmol/L — ABNORMAL LOW (ref 101–111)
GFR calc Af Amer: >60 mL/min (ref >60)
GLUCOSE: 94 mg/dL (ref 65–99)
Potassium: 3.8 mmol/L (ref 3.5–5.1)
SODIUM: 143 mmol/L (ref 135–145)

## 2015-12-27 LAB — LEGIONELLA ANTIGEN, URINE

## 2015-12-27 LAB — CBC
HEMATOCRIT: 35.7 % — AB (ref 39.0–52.0)
Hemoglobin: 11.2 g/dL — ABNORMAL LOW (ref 13.0–17.0)
MCH: 29.3 pg (ref 26.0–34.0)
MCHC: 31.4 g/dL (ref 30.0–36.0)
MCV: 93.5 fL (ref 78.0–100.0)
PLATELETS: 182 10*3/uL (ref 150–400)
RBC: 3.82 MIL/uL — ABNORMAL LOW (ref 4.22–5.81)
RDW: 14.7 % (ref 11.5–15.5)
WBC: 8.1 10*3/uL (ref 4.0–10.5)

## 2015-12-27 MED ORDER — TEMAZEPAM 7.5 MG PO CAPS
7.5000 mg | ORAL_CAPSULE | Freq: Every day | ORAL | Status: DC
  Administered 2015-12-27 – 2015-12-30 (×4): 7.5 mg via ORAL
  Filled 2015-12-27 (×4): qty 1

## 2015-12-27 MED ORDER — ONDANSETRON HCL 4 MG/2ML IJ SOLN
4.0000 mg | Freq: Three times a day (TID) | INTRAMUSCULAR | Status: DC
  Administered 2015-12-27 – 2015-12-29 (×7): 4 mg via INTRAVENOUS
  Filled 2015-12-27 (×7): qty 2

## 2015-12-27 MED ORDER — ONDANSETRON HCL 4 MG/2ML IJ SOLN: 4.0000 mg | Freq: Three times a day (TID) | INTRAMUSCULAR | Status: DC | PRN

## 2015-12-27 NOTE — Consult Note (Signed)
Consultation Note Date: 12/27/2015   Patient Name: Shane Simpson  DOB: [birth year]  MRN: [id number]  Age / Sex: 82 y.o., male  PCP: Shane Post, MD Referring Physician: Koleen Nimrod Simpson*  Reason for Consultation: Establishing goals of care, Hospice Evaluation, Non pain symptom management and Psychosocial/spiritual support    Clinical Assessment/Narrative: Shane Simpson is resting in bed with his son, Shane Simpson, by his bedside.  We talk about his sytptoms, and I schedule his zofran.  He tells me that he had taken a chemo pill for skin cancer that has made him nauseous.  He shares that he quit taking one month ago.  I ask Shane Simpson how he feels about this, and he shares that he wishes his father had never started it, and he will support his decision to stop treatments.  They talk about his physical decline over the last 3 -4 weeks ( no longer able to get in bathtub).  He now has nausea that makes him "miserable".    We talk about going to the SNF for rehab [facility]), and then home with hospice if he wants.  Shane Simpson tells me that at this point he does not want to have rehab, just go with hospice.  I encourage him to try scheduled zofran and see how he feels tomorrow.  We also talk about getting a good night's sleep.  Shane Simpson does not seem depressed he shares that he has completed what he came to do and is not afraid to die.    Contacts/Participants in Discussion: Shane Simpson and son Shane Simpson Primary Decision Maker: Shane Simpson and son Shane Simpson   Relationship to Patient self and son Shane Simpson HCPOA: no   SUMMARY OF RECOMMENDATIONS  Code Status/Advance Care Planning: DNR He tells me that he has "completed what I came to do" and "I'm not afraid to die".     Code Status Orders        Start     Ordered   12/24/15 2328  Do not attempt resuscitation (DNR)   Continuous    Question Answer Comment  In the event of cardiac or  respiratory ARREST Do not call a "code blue"   In the event of cardiac or respiratory ARREST Do not perform Intubation, CPR, defibrillation or ACLS   In the event of cardiac or respiratory ARREST Use medication by any route, position, wound care, and other measures to relive pain and suffering. May use oxygen, suction and manual treatment of airway obstruction as needed for comfort.      12/24/15 2327    Code Status History    Date Active Date Inactive Code Status Order ID Comments User Context   12/24/2015 10:21 PM 12/24/2015 11:27 PM Full Code [id number]  Phillips Grout, MD ED    Advance Directive Documentation        Most Recent Value   Type of Advance Directive  Healthcare Power of [place], Living will   Pre-existing out of facility DNR order (yellow form or pink MOST form)     "MOST" Form in Place?        Other Directives:None  Symptom Management:   zofran 4 mg Q 8 hours scheduled.   Temazepam 7.5 mg Q HS.   Palliative Prophylaxis:   Frequent Pain Assessment, Oral Care and Turn Reposition  Additional Recommendations (Limitations, Scope, Preferences):  Treat the treatable, considering Hospice in SNF.   Psycho-social/Spiritual:  Support System: Strong Desire for further Chaplaincy support:Not discussed at this time.  Additional Recommendations: Education on Hospice  Prognosis: Unable to determine, based on outcomes.   Discharge Planning: Shane Simpson states he would like to go to [facility], he is undecided if he will take rehab or just go with Hospice.    Chief Complaint/ Primary Diagnoses: Present on Admission:  . PACEMAKER, PERMANENT-[employer] . CKD (chronic kidney disease) stage 3, GFR 30-59 ml/min . Essential hypertension . HCAP (healthcare-associated pneumonia) . Nausea & vomiting . Elevated troponin . Pleural effusion on right . Ileus ([place])  I have reviewed the medical record, interviewed the patient and family, and examined the patient. The  following aspects are pertinent.  Past Medical History  Diagnosis Date  . HYPERLIPIDEMIA 07/09/2009  . BELLS PALSY 07/09/2009  . GLUCOMA 07/09/2009  . Essential hypertension, benign 07/09/2009  . HYPERTENSION 08/06/2009  . INGUINAL HERNIA, RIGHT 08/06/2009  . CONSTIPATION 08/06/2009  . URINARY INCONTINENCE 08/06/2009  . PSA, INCREASED 07/09/2009  . SKIN CANCER, HX OF 08/06/2009  . BENIGN PROSTATIC HYPERTROPHY, HX OF 07/09/2009  . PACEMAKER, PERMANENT 02/22/2010    [employer] DDD  . Sinus node dysfunction [facility])    Social History   Social History  . Marital Status: Married    Spouse Name: N/A  . Number of Children: N/A  . Years of Education: N/A   Social History Main Topics  . Smoking status: Former Smoker    Quit date: 11/27/1980  . Smokeless tobacco: None  . Alcohol Use: No  . Drug Use: None  . Sexual Activity: Not Asked   Other Topics Concern  . None   Social History Narrative   Family History  Problem Relation Age of Onset  . Arthritis Other   . Cancer Other 50    breast  . Hypertension Other   . Heart disease Other   . Stroke Other    Scheduled Meds: . bisacodyl  5 mg Oral QODAY  . docusate sodium  200 mg Oral BID  . enoxaparin (LOVENOX) injection  40 mg Subcutaneous Q24H  . latanoprost  1 drop Both Eyes QHS  . ondansetron (ZOFRAN) IV  4 mg Intravenous 3 times per day  . piperacillin-tazobactam (ZOSYN)  IV  3.375 g Intravenous Q8H  . Polyethyl Glycol-Propyl Glycol  1 application Both Eyes Daily  . senna  1 tablet Oral QODAY  . temazepam  7.5 mg Oral QHS   Continuous Infusions:  PRN Meds:. Medications Prior to Admission:  Prior to Admission medications   Medication Sig Start Date End Date Taking? Authorizing Provider  bisacodyl (DULCOLAX) 5 MG EC tablet Take 5 mg by mouth every other day.   Yes Historical Provider, MD  docusate sodium (COLACE) 250 MG capsule Take 250 mg by mouth 2 (two) times daily.   Yes Historical Provider, MD  hydrochlorothiazide 25 MG  tablet Take 25 mg by mouth daily.     Yes Historical Provider, MD  latanoprost (XALATAN) 0.005 % ophthalmic solution Place 1 drop into both eyes at bedtime.  04/12/13  Yes Historical Provider, MD  metoprolol (LOPRESSOR) 50 MG tablet Take 50 mg by mouth 2 (two) times daily.     Yes Historical Provider, MD  Multiple Vitamins-Minerals (ICAPS MV PO) Take 1 tablet by mouth 2 (two) times daily.   Yes Historical Provider, MD  Omega-3 Fatty Acids (FISH OIL PO) Take 1 capsule by mouth daily.   Yes Historical Provider, MD  ondansetron (ZOFRAN) 4 MG tablet Take 4 mg by mouth 2 (two) times daily.   Yes Historical Provider,  MD  Polyethyl Glycol-Propyl Glycol (SYSTANE) 0.4-0.3 % GEL ophthalmic gel Place 1 application into both eyes daily.   Yes Historical Provider, MD  senna (SENOKOT) 8.6 MG TABS tablet Take 1 tablet by mouth every other day.   Yes Historical Provider, MD  ERIVEDGE 150 MG capsule Take 150 mg by mouth daily. 11/17/15   Historical Provider, MD   No Known Allergies  Review of Systems  Unable to perform ROS: Age    Physical Exam  Nursing note and vitals reviewed. Constitutional: He is oriented to person, place, and time. No distress.  Thin frail  HENT:  Head: Normocephalic and atraumatic.  Cardiovascular: Normal rate.   Respiratory: Effort normal. No respiratory distress.  GI: Soft.  Neurological: He is alert and oriented to person, place, and time.  Skin: Skin is warm and dry.    Vital Signs: BP 172/50 mmHg  Pulse 82  Temp(Src) 98.2 F (36.8 C) (Oral)  Resp 20  Ht 5\' 7"  (1.702 m)  Wt 73.074 kg (161 lb 1.6 oz)  BMI 25.23 kg/m2  SpO2 98%  SpO2: SpO2: 98 % O2 Device:SpO2: 98 % O2 Flow Rate: .O2 Flow Rate (L/min): 3 L/min  IO: Intake/output summary:  Intake/Output Summary (Last 24 hours) at 12/27/15 1511 Last data filed at 12/27/15 0900  Gross per 24 hour  Intake    880 ml  Output    700 ml  Net    180 ml    LBM: Last BM Date: 12/25/15 Baseline Weight: Weight: 68.04 kg  (150 lb) Most recent weight: Weight: 73.074 kg (161 lb 1.6 oz)      Palliative Assessment/Data:  Flowsheet Rows        Most Recent Value   Intake Tab    Referral Department  Hospitalist   Unit at Time of Referral  Med/Surg Unit   Palliative Care Primary Diagnosis  Other (Comment)   Date Notified  12/27/15   Palliative Care Type  New Palliative care   Reason for referral  Clarify Goals of Care, End of Life Care Assistance, Psychosocial or Spiritual support   Date of Admission  12/24/15   Date first seen by Palliative Care  12/27/15   # of days Palliative referral response time  0 Day(s)   # of days IP prior to Palliative referral  3   Clinical Assessment    Palliative Performance Scale Score  30%   Pain Max last 24 hours  0   Pain Min Last 24 hours  0   Dyspnea Max Last 24 Hours  0   Dyspnea Min Last 24 hours  0   Psychosocial & Spiritual Assessment    Social Work Plan of Care  Staff support   Palliative Care Outcomes    Patient/Family meeting held?  Yes   Who was at the meeting?  patient and son Shane Simpson   Palliative Care Outcomes  Improved non-pain symptom therapy, Clarified goals of care, Provided end of life care assistance, Provided advance care planning, Provided psychosocial or spiritual support   Patient/Family wishes: Interventions discontinued/not started   Mechanical Ventilation   Palliative Care follow-up planned  Yes, Facility      Additional Data Reviewed:  CBC:    Component Value Date/Time   WBC 8.1 12/27/2015 0628   HGB 11.2* 12/27/2015 0628   HCT 35.7* 12/27/2015 0628   PLT 182 12/27/2015 0628   MCV 93.5 12/27/2015 0628   NEUTROABS 7.3 12/24/2015 1759   LYMPHSABS 0.7 12/24/2015 1759   MONOABS  0.7 12/24/2015 1759   EOSABS 0.1 12/24/2015 1759   BASOSABS 0.0 12/24/2015 1759   Comprehensive Metabolic Panel:    Component Value Date/Time   NA 143 12/27/2015 0628   K 3.8 12/27/2015 0628   CL 100* 12/27/2015 0628   CO2 30 12/27/2015 0628   BUN 12  12/27/2015 0628   CREATININE 1.02 12/27/2015 0628   GLUCOSE 94 12/27/2015 0628   CALCIUM 8.9 12/27/2015 0628   AST 22 12/24/2015 1759   ALT 20 12/24/2015 1759   ALKPHOS 101 12/24/2015 1759   BILITOT 1.7* 12/24/2015 1759   PROT 6.6 12/24/2015 1759   ALBUMIN 3.4* 12/24/2015 1759     Time In: 1250 Time Out: 1400 Time Total: 70 minutes GOC discussion shared with nursing staff, CM, SW, and Dr. Jerilee Hoh.  Greater than 50%  of this time was spent counseling and coordinating care related to the above assessment and plan.  Signed by: Drue Novel, NP  Drue Novel, NP  12/27/2015, 3:11 PM  Please contact Palliative Medicine Team phone at [phone] for questions and concerns.

## 2015-12-27 NOTE — Evaluation (Signed)
Clinical/Bedside Swallow Evaluation Patient Details  Name: Shane Simpson MRN: [id number] Date of Birth: 11-Mar-1920  Today's Date: 12/27/2015 Time: SLP Start Time (ACUTE ONLY): 1205 SLP Stop Time (ACUTE ONLY): 1240 SLP Time Calculation (min) (ACUTE ONLY): 35 min  Past Medical History:  Past Medical History  Diagnosis Date  . HYPERLIPIDEMIA 07/09/2009  . BELLS PALSY 07/09/2009  . GLUCOMA 07/09/2009  . Essential hypertension, benign 07/09/2009  . HYPERTENSION 08/06/2009  . INGUINAL HERNIA, RIGHT 08/06/2009  . CONSTIPATION 08/06/2009  . URINARY INCONTINENCE 08/06/2009  . PSA, INCREASED 07/09/2009  . SKIN CANCER, HX OF 08/06/2009  . BENIGN PROSTATIC HYPERTROPHY, HX OF 07/09/2009  . PACEMAKER, PERMANENT 02/22/2010    [employer] DDD  . Sinus node dysfunction [facility])    Past Surgical History:  Past Surgical History  Procedure Laterality Date  . Pacemaker placement  04/2006  . Abdominal aortic aneurysm repair  04/2006  . Cholecystectomy  04/2006  . Hernia repair  1997    Left inguinal  . Prostate surgery  2009    Cancer   HPI:  [name] is a 82 yo male who lives at home alone and has been having one month of postprandial nausea. He thought it was due to his chemo pill (Erivedge) he was on for his skin cancer so he stopped this but his nausea has persisted. Nausea is worse after eating. No diarrhea. S/p chole. He has felt bloated. In the last ten days he has also had a significant change in his breathing. His is very sob, has been having chills, does not know if he is running fever. Has been having a productive cough which is nonbloody. No swelling in his legs. He was hospitalized at [facility] 2 weeks ago for the nausea and he was dehydrated, he was told everything looked fine but he was dehydrated he was given some ivf, no abx and sent home. Pt has been given some ivf and oxygen [place] in the ED and he feels much better. His sob is better. Found to have pna and ileus. He has been on a  clear liquid diet since admission. He was advanced to solids this AM, but pt requested change back to clear liquids. SLP asked to evaluate swallow.   Assessment / Plan / Recommendation Clinical Impression  [name] had just vomited prior to my arrival. SLP assisted pt in cleaning up yellow looking emesis (bile?) and notified RN. Pt reportedly has not eating anything today, but did have chicken broth yesterday.  He reported that he was taking a chemotherapy medication called Erivedge for a couple of months, but stopped taking a few weeks ago due to negative side effects per pt. He reportedly has not been able to eat/drink very much in the past few weeks and was admitted to [facility] for the same a couple weeks ago. He feels he has not gotten any better.  He has not told his dermatologist that he has gone off the medication.   [name] presents with mild oral weakness in setting of global weakness from current debilitation. He was alert and cooperative throughout my visit, although he was wary about trying different textures with SLP due to fear of emesis. Pt tolerated ice chips without incident (after oral care) and was agreeable to a few sips thin liquid and sherbet. He declined puree trials. Pharyngeal swallow appears timely and pt shows no overt signs or symptoms of aspiration on limited trials. Pt does not endorse nausea per say so he may be  experiencing more regurgitation from ileus? Would recommend continuing clear liquids as per pt comfort level and upgrade to full liquids with nutrition consult. SLP will follow while in acute setting for upgrades and tolerance as appropriate. Erivedge does have a fairly long half life (spoke with pharmacist) so perhaps he is still feeling effects of chemo. Pt expressed that he is "not afraid to die" and is in fact "ready". Above to Dr. Jerilee Hoh.    Aspiration Risk  Mild aspiration risk    Diet Recommendation  (continue clear liquids for now)   Liquid  Administration via: Cup;Straw Medication Administration: Whole meds with liquid Supervision: Intermittent supervision to cue for compensatory strategies;Patient able to self feed (pt very weak and will need assist) Compensations: Small sips/bites Postural Changes: Seated upright at 90 degrees;Remain upright for at least 30 minutes after po intake    Other  Recommendations Oral Care Recommendations: Oral care BID;Staff/trained caregiver to provide oral care Other Recommendations: Clarify dietary restrictions   Follow up Recommendations  Skilled Nursing facility;Home health SLP    Frequency and Duration min 2x/week  1 week       Prognosis Prognosis for Safe Diet Advancement: Fair Barriers to Reach Goals:  (nausea) Barriers/Prognosis Comment:  (may need to consider short term FT due to nausea)      Swallow Study   General Date of Onset: 12/24/15 HPI: [name] is a 82 yo male who lives at home alone and has been having one month of postprandial nausea. He thought it was due to his chemo pill (Erivedge) he was on for his skin cancer so he stopped this but his nausea has persisted. Nausea is worse after eating. No diarrhea. S/p chole. He has felt bloated. In the last ten days he has also had a significant change in his breathing. His is very sob, has been having chills, does not know if he is running fever. Has been having a productive cough which is nonbloody. No swelling in his legs. He was hospitalized at [facility] 2 weeks ago for the nausea and he was dehydrated, he was told everything looked fine but he was dehydrated he was given some ivf, no abx and sent home. Pt has been given some ivf and oxygen [place] in the ED and he feels much better. His sob is better. Found to have pna and ileus. He has been on a clear liquid diet since admission. He was advanced to solids this AM, but pt requested change back to clear liquids. SLP asked to evaluate swallow. Type of Study: Bedside  Swallow Evaluation Diet Prior to this Study:  (clear liquids) Temperature Spikes Noted: No Respiratory Status: Nasal cannula History of Recent Intubation: No Behavior/Cognition: Alert;Cooperative;Pleasant mood Oral Cavity Assessment: Erythema Oral Care Completed by SLP: Yes Oral Cavity - Dentition: Edentulous (pt has dentures present that he doesn't usually wear) Vision: Functional for self-feeding Self-Feeding Abilities: Able to feed self (very weak) Patient Positioning: Upright in bed Baseline Vocal Quality: Normal Volitional Cough: Weak Volitional Swallow: Able to elicit    Oral/Motor/Sensory Function Overall Oral Motor/Sensory Function: Mild impairment Facial ROM: Reduced right (h/o Bell's Palsy 80% recovery) Facial Symmetry: Abnormal symmetry right Facial Strength: Reduced right Lingual ROM: Within Functional Limits Lingual Symmetry: Within Functional Limits Lingual Strength: Reduced Lingual Sensation: Within Functional Limits Velum: Within Functional Limits Mandible: Within Functional Limits   Ice Chips Ice chips: Within functional limits Presentation: Spoon   Thin Liquid Thin Liquid: Within functional limits Presentation: Cup;Straw;Spoon    Nectar Thick  Nectar Thick Liquid: Within functional limits Presentation: Spoon   Honey Thick Honey Thick Liquid: Not tested   Puree Puree: Not tested   Solid   Thank you,  Genene Churn, CCC-SLP [phone]    Solid: Not tested        PORTER,DABNEY 12/27/2015,2:03 PM

## 2015-12-27 NOTE — Progress Notes (Signed)
   12/27/15 0825  Vitals  BP (!) 172/50 mmHg  BP Location Left Arm  BP Method Manual  Patient Position (if appropriate) Lying  Notified Dr. Jerilee Hoh of the BP and of the patients home heart medications.  MD came to see the patient.

## 2015-12-27 NOTE — Progress Notes (Signed)
[facility] PROGRESS NOTE  Shane Simpson [id number] DOB: 07/09/1920 DOA: 12/24/2015 PCP: Eulas Post, MD  Assessment/Plan: Hospital-acquired pneumonia -He feels symptomatically improved, although still weak. -I will discontinue vancomycin given negative PCR and continue Zosyn. -Check strep pneumo (negative) and legionella urinary antigens (pending).  Elevated troponins -Likely represents demand ischemia due to pneumonia. -No EKG changes. -Check echocardiogram to make sure EF is preserved (pending).  Ileus -Patient is not complaining of abdominal pain, has not had any nausea or vomiting and is requesting food. Continue clear liquids per patient request.  Chronic kidney disease stage III -Better than baseline.  Code Status: DO NOT RESUSCITATE Family Communication: Son at bedside updated on plan of care  Disposition Plan: To be determined. Will likely need SNF. Patient tells me he "wants to die". Will request a palliative care consultation.   Consultants:  None   Antibiotics:  Zosyn   Subjective: Feels very weak, shortness of breath has improved, describes no vomiting or abdominal pain. Tells me he is ready to die.  Objective: Filed Vitals:   12/27/15 0622 12/27/15 0700 12/27/15 0808 12/27/15 0825  BP: 188/64 176/40 170/57 172/50  Pulse: 82 80 82   Temp: 98.1 F (36.7 C) 98.2 F (36.8 C)    TempSrc: Oral Oral    Resp: 20     Height:      Weight:      SpO2: 98%  98%     Intake/Output Summary (Last 24 hours) at 12/27/15 1006 Last data filed at 12/27/15 0900  Gross per 24 hour  Intake   1000 ml  Output    700 ml  Net    300 ml   Filed Weights   12/24/15 1719 12/24/15 2256  Weight: 68.04 kg (150 lb) 73.074 kg (161 lb 1.6 oz)    Exam:   General:  Alert, awake, oriented 3  Cardiovascular: Regular rate and rhythm  Respiratory: Coarse bilateral breath sounds, crackles over the right lung fields and decreased breath sounds at  right base  Abdomen: Soft, nontender, nondistended, positive bowel sounds  Extremities: No clubbing, cyanosis or edema, positive pulses   Neurologic:  Grossly intact and nonfocal, had not ambulated him  Data Reviewed: Basic Metabolic Panel:  Recent Labs Lab 12/24/15 1759 12/27/15 0628  NA 142 143  K 4.2 3.8  CL 99* 100*  CO2 25 30  GLUCOSE 116* 94  BUN 18 12  CREATININE 1.11 1.02  CALCIUM 9.3 8.9   Liver Function Tests:  Recent Labs Lab 12/24/15 1759  AST 22  ALT 20  ALKPHOS 101  BILITOT 1.7*  PROT 6.6  ALBUMIN 3.4*    Recent Labs Lab 12/24/15 1759  LIPASE 19   No results for input(s): AMMONIA in the last 168 hours. CBC:  Recent Labs Lab 12/24/15 1759 12/27/15 0628  WBC 8.9 8.1  NEUTROABS 7.3  --   HGB 12.4* 11.2*  HCT 39.5 35.7*  MCV 94.5 93.5  PLT 167 182   Cardiac Enzymes:  Recent Labs Lab 12/24/15 1759 12/24/15 2236 12/25/15 0353 12/25/15 1035  TROPONINI 0.04* 0.04* 0.05* 0.05*   BNP (last 3 results)  Recent Labs  12/24/15 2236  BNP 29.0    ProBNP (last 3 results) No results for input(s): PROBNP in the last 8760 hours.  CBG: No results for input(s): GLUCAP in the last 168 hours.  Recent Results (from the past 240 hour(s))  Culture, blood (routine x 2) Call MD if unable to obtain prior  to antibiotics being given     Status: None (Preliminary result)   Collection Time: 12/24/15 10:27 PM  Result Value Ref Range Status   Specimen Description RIGHT ANTECUBITAL  Final   Special Requests BOTTLES DRAWN AEROBIC ONLY 4CC ONLY  Final   Culture NO GROWTH < 12 HOURS  Final   Report Status PENDING  Incomplete  Culture, blood (routine x 2) Call MD if unable to obtain prior to antibiotics being given     Status: None (Preliminary result)   Collection Time: 12/24/15 10:36 PM  Result Value Ref Range Status   Specimen Description RIGHT ANTECUBITAL  Final   Special Requests BOTTLES DRAWN AEROBIC ONLY 4CC ONLY  Final   Culture NO GROWTH < 12  HOURS  Final   Report Status PENDING  Incomplete  MRSA PCR Screening     Status: None   Collection Time: 12/26/15  8:30 AM  Result Value Ref Range Status   MRSA by PCR NEGATIVE NEGATIVE Final    Comment:        The GeneXpert MRSA Assay (FDA approved for NASAL specimens only), is one component of a comprehensive MRSA colonization surveillance program. It is not intended to diagnose MRSA infection nor to guide or monitor treatment for MRSA infections.      Studies: No results found.  Scheduled Meds: . bisacodyl  5 mg Oral QODAY  . docusate sodium  200 mg Oral BID  . enoxaparin (LOVENOX) injection  40 mg Subcutaneous Q24H  . latanoprost  1 drop Both Eyes QHS  . piperacillin-tazobactam (ZOSYN)  IV  3.375 g Intravenous Q8H  . Polyethyl Glycol-Propyl Glycol  1 application Both Eyes Daily  . senna  1 tablet Oral QODAY   Continuous Infusions:   Principal Problem:   HCAP (healthcare-associated pneumonia) Active Problems:   Essential hypertension   PACEMAKER, PERMANENT-[employer]   CKD (chronic kidney disease) stage 3, GFR 30-59 ml/min   Nausea & vomiting   Elevated troponin   Pleural effusion on right   Ileus (HCC)    Time spent: 25 minutes. Greater than 50% of this time was spent in direct contact with the patient coordinating care.    Lelon Frohlich  [facility] Pager [phone]  If 7PM-7AM, please contact night-coverage at [email or web address], password [email or web address] 12/27/2015, 10:06 AM  LOS: 2 days

## 2015-12-28 ENCOUNTER — Inpatient Hospital Stay (HOSPITAL_COMMUNITY): Payer: Medicare Other

## 2015-12-28 MED ORDER — SODIUM CHLORIDE 0.9 % IV SOLN: 250.0000 mL | INTRAVENOUS | Status: DC | PRN

## 2015-12-28 MED ORDER — METOCLOPRAMIDE HCL 5 MG/ML IJ SOLN
5.0000 mg | Freq: Three times a day (TID) | INTRAMUSCULAR | Status: DC
  Administered 2015-12-28 – 2015-12-29 (×4): 5 mg via INTRAVENOUS
  Filled 2015-12-28 (×4): qty 2

## 2015-12-28 MED ORDER — SODIUM CHLORIDE 0.9% FLUSH
3.0000 mL | INTRAVENOUS | Status: DC | PRN
  Administered 2015-12-29: 3 mL via INTRAVENOUS
  Filled 2015-12-28: qty 3

## 2015-12-28 MED ORDER — SODIUM CHLORIDE 0.9% FLUSH
3.0000 mL | Freq: Two times a day (BID) | INTRAVENOUS | Status: DC
  Administered 2015-12-28 – 2015-12-30 (×2): 3 mL via INTRAVENOUS

## 2015-12-28 NOTE — Care Management Note (Signed)
Case Management Note  Patient Details  Name: Shane Simpson MRN: [id number] Date of Birth: 1919-12-27  Subjective/Objective:            Spoke with patient for discharge plan. Patient would like to go to SNF as private pay at time of discharge. Patient is alert and oriented. Stated that he was living independently until about 6 weeks ago then he began taking Chemo therapy medications for skin cancer and he believes this is what has made him so weak. Was not using a walker or cane prior to admission.         Action/Plan:  SNF, With Hospice.  Expected Discharge Date:                  Expected Discharge Plan:  Skilled Nursing Facility  In-House Referral:  Clinical Social Work  Discharge planning Services  CM Consult  Post Acute Care Choice:    Choice offered to:     DME Arranged:    DME Agency:     HH Arranged:    [place] Agency:     Status of Service:  In process, will continue to follow  Medicare Important Message Given:    Date Medicare IM Given:    Medicare IM give by:    Date Additional Medicare IM Given:    Additional Medicare Important Message give by:     If discussed at [place] of Stay Meetings, dates discussed:    Additional Comments:  Alvie Heidelberg, RN 12/28/2015, 3:04 PM

## 2015-12-28 NOTE — Progress Notes (Signed)
[facility] PROGRESS NOTE  Shane Simpson [id number] DOB: August 29, 1920 DOA: 12/24/2015 PCP: Eulas Post, MD  Assessment/Plan: Hospital-acquired pneumonia -He feels symptomatically improved, although still weak. -I will discontinue vancomycin given negative PCR and continue Zosyn. -Check strep pneumo (negative) and legionella urinary antigens (negative).  Elevated troponins -Likely represents demand ischemia due to pneumonia. -No EKG changes. -EF is preserved on ECHO, 55-60%.  Ileus -Patient is not complaining of abdominal pain. -Repeat abd film shows ileus has resolved. -He continues to complain of nausea despite scheduled zofran. Will add reglan to aid with symptoms. -Continue clear liquids per patient request.  Chronic kidney disease stage III -Better than baseline.  Code Status: DO NOT RESUSCITATE Family Communication: Son at bedside updated on plan of care  Disposition Plan: To be determined. Will likely need SNF. Currently followed by palliative care.  Consultants:  None   Antibiotics:  Zosyn   Subjective: Feels very weak, shortness of breath has improved, describes no vomiting or abdominal pain. Tells me he is ready to die. Continues to feel quite nauseous and this is his main complaint today.  Objective: Filed Vitals:   12/27/15 0825 12/27/15 1528 12/27/15 2055 12/28/15 0612  BP: 172/50 132/52 129/42 158/55  Pulse:  88 83 94  Temp:  98.3 F (36.8 C) 98.1 F (36.7 C) 97.8 F (36.6 C)  TempSrc:   Oral Oral  Resp:  18 18 20   Height:      Weight:      SpO2:  97% 94% 94%    Intake/Output Summary (Last 24 hours) at 12/28/15 1429 Last data filed at 12/28/15 [id number]  Gross per 24 hour  Intake    390 ml  Output    200 ml  Net    190 ml   Filed Weights   12/24/15 1719 12/24/15 2256  Weight: 68.04 kg (150 lb) 73.074 kg (161 lb 1.6 oz)    Exam:   General:  Alert, awake, oriented 3  Cardiovascular: Regular rate and  rhythm  Respiratory: Coarse bilateral breath sounds, crackles over the right lung fields and decreased breath sounds at right base  Abdomen: Soft, nontender, nondistended, positive bowel sounds  Extremities: No clubbing, cyanosis or edema, positive pulses   Neurologic:  Grossly intact and nonfocal, had not ambulated him  Data Reviewed: Basic Metabolic Panel:  Recent Labs Lab 12/24/15 1759 12/27/15 0628  NA 142 143  K 4.2 3.8  CL 99* 100*  CO2 25 30  GLUCOSE 116* 94  BUN 18 12  CREATININE 1.11 1.02  CALCIUM 9.3 8.9   Liver Function Tests:  Recent Labs Lab 12/24/15 1759  AST 22  ALT 20  ALKPHOS 101  BILITOT 1.7*  PROT 6.6  ALBUMIN 3.4*    Recent Labs Lab 12/24/15 1759  LIPASE 19   No results for input(s): AMMONIA in the last 168 hours. CBC:  Recent Labs Lab 12/24/15 1759 12/27/15 0628  WBC 8.9 8.1  NEUTROABS 7.3  --   HGB 12.4* 11.2*  HCT 39.5 35.7*  MCV 94.5 93.5  PLT 167 182   Cardiac Enzymes:  Recent Labs Lab 12/24/15 1759 12/24/15 2236 12/25/15 0353 12/25/15 1035  TROPONINI 0.04* 0.04* 0.05* 0.05*   BNP (last 3 results)  Recent Labs  12/24/15 2236  BNP 29.0    ProBNP (last 3 results) No results for input(s): PROBNP in the last 8760 hours.  CBG: No results for input(s): GLUCAP in the last 168 hours.  Recent Results (from the past  240 hour(s))  Culture, blood (routine x 2) Call MD if unable to obtain prior to antibiotics being given     Status: None (Preliminary result)   Collection Time: 12/24/15 10:27 PM  Result Value Ref Range Status   Specimen Description BLOOD RIGHT ANTECUBITAL  Final   Special Requests BOTTLES DRAWN AEROBIC ONLY 4CC ONLY  Final   Culture NO GROWTH 4 DAYS  Final   Report Status PENDING  Incomplete  Culture, blood (routine x 2) Call MD if unable to obtain prior to antibiotics being given     Status: None (Preliminary result)   Collection Time: 12/24/15 10:36 PM  Result Value Ref Range Status   Specimen  Description BLOOD RIGHT ANTECUBITAL  Final   Special Requests BOTTLES DRAWN AEROBIC ONLY 4CC ONLY  Final   Culture NO GROWTH 4 DAYS  Final   Report Status PENDING  Incomplete  MRSA PCR Screening     Status: None   Collection Time: 12/26/15  8:30 AM  Result Value Ref Range Status   MRSA by PCR NEGATIVE NEGATIVE Final    Comment:        The GeneXpert MRSA Assay (FDA approved for NASAL specimens only), is one component of a comprehensive MRSA colonization surveillance program. It is not intended to diagnose MRSA infection nor to guide or monitor treatment for MRSA infections.      Studies: Dg Abd 1 View  12/28/2015  CLINICAL DATA:  Nausea EXAM: ABDOMEN - 1 VIEW COMPARISON:  12/24/2015 FINDINGS: Scattered large and small bowel gas is noted. The degree of bowel gas has decreased in the interval from the prior exam. No obstructive changes are noted. Aortic calcifications are seen and stable. No free air is noted. IMPRESSION: No acute abnormality noted. The degree of gaseous distention has improved in the interval from the prior exam. Electronically Signed   By: Inez Catalina M.D.   On: 12/28/2015 12:29    Scheduled Meds: . bisacodyl  5 mg Oral QODAY  . docusate sodium  200 mg Oral BID  . enoxaparin (LOVENOX) injection  40 mg Subcutaneous Q24H  . latanoprost  1 drop Both Eyes QHS  . metoCLOPramide (REGLAN) injection  5 mg Intravenous 3 times per day  . ondansetron (ZOFRAN) IV  4 mg Intravenous 3 times per day  . piperacillin-tazobactam (ZOSYN)  IV  3.375 g Intravenous Q8H  . Polyethyl Glycol-Propyl Glycol  1 application Both Eyes Daily  . senna  1 tablet Oral QODAY  . temazepam  7.5 mg Oral QHS   Continuous Infusions:   Principal Problem:   HCAP (healthcare-associated pneumonia) Active Problems:   Essential hypertension   PACEMAKER, PERMANENT-[employer]   CKD (chronic kidney disease) stage 3, GFR 30-59 ml/min   Nausea & vomiting   Elevated troponin   Pleural effusion on right    Ileus [facility])   Palliative care encounter    Time spent: 25 minutes. Greater than 50% of this time was spent in direct contact with the patient coordinating care.    Lelon Frohlich  [facility] Pager [phone]  If 7PM-7AM, please contact night-coverage at [email or web address], password [facility] 12/28/2015, 2:29 PM  LOS: 3 days

## 2015-12-28 NOTE — Progress Notes (Signed)
Daily Progress Note   Patient Name: Shane Simpson       Date: 12/28/2015 DOB: 1920/10/17  Age: 82 y.o. MRN#: [id number] Attending Physician: Mikki Harbor* Primary Care Physician: Eulas Post, MD Admit Date: 12/24/2015  Reason for Consultation/Follow-up: Establishing goals of care, Non pain symptom management and Psychosocial/spiritual support  Subjective: Shane Simpson is sitting up in bed with his son Shane Simpson at his side.  He shares that he was woken in the night for VS check and when he told them he was not to be bothered, response was "on whose authority?".  I apologize for this, and share with nursing that Shane Simpson needs to sleep through the night if possible.  He tells me that his nausea is only slightly better after scheduled Zofran.  His son also shares that he needs help eating. We talk about endoscopy (not recommended) and he tells me that he would not want this.  We also talk about NG tube and he shares that he is willing to try anything to relieve his nausea.  He shares that he has passed some gas and had BM, both described as "little".  Conference with Shane Simpson who suggests NG or Reglan 5 mg TID if there is not a complete obstruction.  Conference with Shane Simpson.   Length of Stay: 3 days  Current Medications: Scheduled Meds:  . bisacodyl  5 mg Oral QODAY  . docusate sodium  200 mg Oral BID  . enoxaparin (LOVENOX) injection  40 mg Subcutaneous Q24H  . latanoprost  1 drop Both Eyes QHS  . ondansetron (ZOFRAN) IV  4 mg Intravenous 3 times per day  . piperacillin-tazobactam (ZOSYN)  IV  3.375 g Intravenous Q8H  . Polyethyl Glycol-Propyl Glycol  1 application Both Eyes Daily  . senna  1 tablet Oral QODAY  . temazepam  7.5 mg Oral QHS    Continuous  Infusions:    PRN Meds:   Physical Exam: Physical Exam  Constitutional: He is oriented to person, place, and time.  Frail, thin, elderly  HENT:  Head: Normocephalic and atraumatic.  Cardiovascular: Normal rate.   Pulmonary/Chest: Effort normal. No respiratory distress.  Abdominal: Soft. There is tenderness.  Neurological: He is alert and oriented to person, place, and time.  Skin: Skin is warm and dry.  Nursing note and vitals reviewed.               Vital Signs: BP 158/55 mmHg  Pulse 94  Temp(Src) 97.8 F (36.6 C) (Oral)  Resp 20  Ht 5\' 7"  (1.702 m)  Wt 73.074 kg (161 lb 1.6 oz)  BMI 25.23 kg/m2  SpO2 94% SpO2: SpO2: 94 % O2 Device: O2 Device: Nasal Cannula O2 Flow Rate: O2 Flow Rate (L/min): 3 L/min  Intake/output summary:  Intake/Output Summary (Last 24 hours) at 12/28/15 1315 Last data filed at 12/28/15 [id number]  Gross per 24 hour  Intake    440 ml  Output    200 ml  Net    240 ml   LBM: Last BM Date: 12/27/15 Baseline Weight: Weight: 68.04 kg (150 lb) Most recent weight: Weight: 73.074 kg (161 lb 1.6 oz)       Palliative Assessment/Data: Flowsheet Rows        Most Recent Value   Intake Tab    Referral Department  Hospitalist   Unit at Time of Referral  Med/Surg Unit   Palliative Care Primary Diagnosis  Other (Comment)   Date Notified  12/27/15   Palliative Care Type  New Palliative care   Reason for referral  Clarify Goals of Care, End of Life Care Assistance, Psychosocial or Spiritual support   Date of Admission  12/24/15   Date first seen by Palliative Care  12/27/15   # of days Palliative referral response time  0 Day(s)   # of days IP prior to Palliative referral  3   Clinical Assessment    Palliative Performance Scale Score  30%   Pain Max last 24 hours  0   Pain Min Last 24 hours  0   Dyspnea Max Last 24 Hours  0   Dyspnea Min Last 24 hours  0   Psychosocial & Spiritual Assessment    Social Work Plan of Care  Staff support   Palliative Care  Outcomes    Patient/Family meeting held?  Yes   Who was at the meeting?  patient and son Shane Simpson   Palliative Care Outcomes  Improved non-pain symptom therapy, Clarified goals of care, Provided end of life care assistance, Provided advance care planning, Provided psychosocial or spiritual support   Patient/Family wishes: Interventions discontinued/not started   Mechanical Ventilation   Palliative Care follow-up planned  Yes, Facility      Additional Data Reviewed: CBC    Component Value Date/Time   WBC 8.1 12/27/2015 0628   RBC 3.82* 12/27/2015 0628   HGB 11.2* 12/27/2015 0628   HCT 35.7* 12/27/2015 0628   PLT 182 12/27/2015 0628   MCV 93.5 12/27/2015 0628   MCH 29.3 12/27/2015 0628   MCHC 31.4 12/27/2015 0628   RDW 14.7 12/27/2015 0628   LYMPHSABS 0.7 12/24/2015 1759   MONOABS 0.7 12/24/2015 1759   EOSABS 0.1 12/24/2015 1759   BASOSABS 0.0 12/24/2015 1759    CMP     Component Value Date/Time   NA 143 12/27/2015 0628   K 3.8 12/27/2015 0628   CL 100* 12/27/2015 0628   CO2 30 12/27/2015 0628   GLUCOSE 94 12/27/2015 0628   BUN 12 12/27/2015 0628   CREATININE 1.02 12/27/2015 0628   CALCIUM 8.9 12/27/2015 0628   PROT 6.6 12/24/2015 1759   ALBUMIN 3.4* 12/24/2015 1759   AST 22 12/24/2015 1759   ALT 20 12/24/2015 1759   ALKPHOS 101 12/24/2015 1759   BILITOT 1.7* 12/24/2015 1759  GFRNONAA >60 12/27/2015 0628   GFRAA >60 12/27/2015 0628       Problem List:  Patient Active Problem List   Diagnosis Date Noted  . Palliative care encounter   . HCAP (healthcare-associated pneumonia) 12/24/2015  . Nausea & vomiting 12/24/2015  . Elevated troponin 12/24/2015  . Ileus ([place]) 12/24/2015  . Uncontrollable vomiting   . Pleural effusion on right   . CKD (chronic kidney disease) stage 3, GFR 30-59 ml/min 11/04/2014  . Macular degeneration 10/25/2012  . Dyspnea on exertion 03/20/2012  . Chest tightness 03/20/2012  . Sinus node dysfunction (HCC)   . PACEMAKER,  PERMANENT-[employer] 02/22/2010  . Essential hypertension 08/06/2009  . INGUINAL HERNIA, RIGHT 08/06/2009  . CONSTIPATION 08/06/2009  . URINARY INCONTINENCE 08/06/2009  . SKIN CANCER, HX OF 08/06/2009  . HYPERLIPIDEMIA 07/09/2009  . BELLS PALSY 07/09/2009  . GLUCOMA 07/09/2009  . PSA, INCREASED 07/09/2009  . BENIGN PROSTATIC HYPERTROPHY, HX OF 07/09/2009     Palliative Care Assessment & Plan    1.Code Status:  DNR    Code Status Orders        Start     Ordered   12/24/15 2328  Do not attempt resuscitation (DNR)   Continuous    Question Answer Comment  In the event of cardiac or respiratory ARREST Do not call a "code blue"   In the event of cardiac or respiratory ARREST Do not perform Intubation, CPR, defibrillation or ACLS   In the event of cardiac or respiratory ARREST Use medication by any route, position, wound care, and other measures to relive pain and suffering. May use oxygen, suction and manual treatment of airway obstruction as needed for comfort.      12/24/15 2327    Code Status History    Date Active Date Inactive Code Status Order ID Comments User Context   12/24/2015 10:21 PM 12/24/2015 11:27 PM Full Code [id number]  Phillips Grout, MD ED    Advance Directive Documentation        Most Recent Value   Type of Advance Directive  Healthcare Power of [place], Living will   Pre-existing out of facility DNR order (yellow form or pink MOST form)     "MOST" Form in Place?         2. Goals of Care/Additional Recommendations:  Improve symptoms, [facility] with Hospice upon DC. Private pay.   Limitations on Scope of Treatment: Treat the treatable at this point.   Desire for further Chaplaincy support:Not discussed today.   Psycho-social Needs: None  3. Symptom Management:      1.per hospitalist.   4. Palliative Prophylaxis:   Bowel Regimen, Frequent Pain Assessment, Oral Care and Turn Reposition  5. Prognosis: Unable to determine, based on outcomes,  likely 6 months at most d/t frailty and FTT.   6. Discharge Planning:  [facility] for LTC with Hospice.    Care plan was discussed with nursing staff, CM, SW, and Shane Simpson.   Thank you for allowing the Palliative Medicine Team to assist in the care of this patient.   Time In: 0915 Time Out: 0930 Total Time 15 minues Prolonged Time Billed  no         Drue Novel, NP  12/28/2015, 1:15 PM  Please contact Palliative Medicine Team phone at [phone] for questions and concerns.

## 2015-12-29 DIAGNOSIS — R1310 Dysphagia, unspecified: Secondary | ICD-10-CM

## 2015-12-29 DIAGNOSIS — E870 Hyperosmolality and hypernatremia: Secondary | ICD-10-CM | POA: Diagnosis not present

## 2015-12-29 DIAGNOSIS — E876 Hypokalemia: Secondary | ICD-10-CM

## 2015-12-29 DIAGNOSIS — K567 Ileus, unspecified: Secondary | ICD-10-CM

## 2015-12-29 DIAGNOSIS — J189 Pneumonia, unspecified organism: Principal | ICD-10-CM

## 2015-12-29 LAB — BASIC METABOLIC PANEL
Anion gap: 13 (ref 5–15)
BUN: 13 mg/dL (ref 6–20)
CO2: 32 mmol/L (ref 22–32)
CREATININE: 1.1 mg/dL (ref 0.61–1.24)
Calcium: 8.6 mg/dL — ABNORMAL LOW (ref 8.9–10.3)
Chloride: 101 mmol/L (ref 101–111)
GFR calc Af Amer: >60 mL/min (ref >60)
GFR, EST NON AFRICAN AMERICAN: 56 mL/min — AB (ref >60)
GLUCOSE: 84 mg/dL (ref 65–99)
POTASSIUM: 3.3 mmol/L — AB (ref 3.5–5.1)
Sodium: 146 mmol/L — ABNORMAL HIGH (ref 135–145)

## 2015-12-29 LAB — CULTURE, BLOOD (ROUTINE X 2)
CULTURE: NO GROWTH
CULTURE: NO GROWTH

## 2015-12-29 MED ORDER — ONDANSETRON 4 MG PO TBDP
4.0000 mg | ORAL_TABLET | Freq: Three times a day (TID) | ORAL | Status: DC
  Administered 2015-12-30 – 2015-12-31 (×4): 4 mg via ORAL
  Filled 2015-12-29 (×4): qty 1

## 2015-12-29 MED ORDER — METOCLOPRAMIDE HCL 10 MG PO TABS
5.0000 mg | ORAL_TABLET | Freq: Three times a day (TID) | ORAL | Status: DC
  Administered 2015-12-29 – 2015-12-31 (×6): 5 mg via ORAL
  Filled 2015-12-29 (×6): qty 1

## 2015-12-29 MED ORDER — SODIUM CHLORIDE 0.45 % IV SOLN
INTRAVENOUS | Status: DC
  Filled 2015-12-29 (×2): qty 1000

## 2015-12-29 MED ORDER — BOOST / RESOURCE BREEZE PO LIQD
1.0000 | Freq: Three times a day (TID) | ORAL | Status: DC
  Administered 2015-12-29 – 2015-12-31 (×5): 1 via ORAL

## 2015-12-29 NOTE — Progress Notes (Signed)
Speech Language Pathology Treatment: Dysphagia  Patient Details Name: Shane Simpson MRN: [id number] DOB: 09-26-1920 Today's Date: 12/29/2015 Time: [id number] SLP Time Calculation (min) (ACUTE ONLY): 29 min  Assessment / Plan / Recommendation Clinical Impression  [name] was seen at bedside this AM. Breakfast tray in room untouched at 9 AM. SLP completed oral care and pt stated he has not been given a tooth brush or mouthwash since SLP here on Monday. Pt also stated that he was very thirsty, but was unable to feed himself and no one offered to assist per patient. SLP showed pt how to use call bell to request assist in the future. He reports generally feeling unwell, but unable to give specifics besides being uncomfortable in bed. SLP repositioned pt. Pt took several ice chips, thin water, vanilla Ensure, jello, and a few bites of applesauce when fed by SLP. Pt with one episode of coughing while drinking water from the cup, which pt attributes to difficulty moving bolus on right side due to history of Bell's Palsy (he reports 80% recovery but still occasional coughing with liquids). Pt with improved performance when allowed to use straw placed on left side of mouth. He required mod verbal cues to "take it slowly". Appetite is still diminished, however recommend advancing diet to D1/puree with thin liquids with allowance for pt to pick and chose what he wants to try. He seems to tolerate the vanilla Ensure over ice fairly well.  Will upgrade to D1/puree and thin liquids, ok for straws, drink slowly. PO meds crushed as able in puree or whole with the liquid. Pt will need 100% feeder assist and 100% assist for oral care. Consider PT evaluation if ready due to general debilitation. F/U at SNF for SLP follow up/diet tolerance and advancement.   HPI HPI: [name] is a 82 yo male who lives at home alone and has been having one month of postprandial nausea. He thought it was due to his chemo pill  (Erivedge) he was on for his skin cancer so he stopped this but his nausea has persisted. Nausea is worse after eating. No diarrhea. S/p chole. He has felt bloated. In the last ten days he has also had a significant change in his breathing. His is very sob, has been having chills, does not know if he is running fever. Has been having a productive cough which is nonbloody. No swelling in his legs. He was hospitalized [facility] 2 weeks ago for the nausea and he was dehydrated, he was told everything looked fine but he was dehydrated he was given some ivf, no abx and sent home. Pt has been given some ivf and oxygen [place] in the ED and he feels much better. His sob is better. Found to have pna and ileus. He has been on a clear liquid diet since admission. He was advanced to solids this AM, but pt requested change back to clear liquids. SLP asked to evaluate swallow.      SLP Plan  Continue with current plan of care     Recommendations  Diet recommendations: Dysphagia 1 (puree);Thin liquid Liquids provided via: Cup;Straw Medication Administration: Crushed with puree Supervision: Staff to assist with self feeding;Full supervision/cueing for compensatory strategies Compensations: Small sips/bites Postural Changes and/or Swallow Maneuvers: Seated upright 90 degrees;Upright 30-60 min after meal             Oral Care Recommendations: Oral care BID;Staff/trained caregiver to provide oral care Follow up Recommendations: Skilled Nursing facility;Home health  SLP Plan: Continue with current plan of care               Thank you,  Genene Churn, [place]     [place] 12/29/2015, 9:37 AM

## 2015-12-29 NOTE — Progress Notes (Signed)
Speech Language Pathology Treatment: Dysphagia  Patient Details Name: Shane Simpson MRN: [id number] DOB: [birth year] Today's Date: 12/29/2015 Time: [id number] SLP Time Calculation (min) (ACUTE ONLY): 19 min  Assessment / Plan / Recommendation Clinical Impression  SLP contacted to RD due to pt reportedly with emesis after po given this AM. Pt seen at bedside with son present. Son states that pt started coughing about 15 minutes after Ensure given this AM and then brought Ensure back up. It is difficult to tell if true emesis or regurgitation from poor motility. SLP provided pt with small amounts of ginger ale this afternoon which he tolerated without signs/symptoms of aspiration. SLP encouraged pt to only take a few sips every 30 minutes or so to see if he can keep it down. Discussed with RN. Continue diet as ordered with RD to help with food choices. Pt requesting "tomato juice", however I would worry about the acidity of it. Pt should certainly try what he requests though- even if just small amounts as he has not eaten full meals for several weeks now. SLP to follow. Hopefully, pt will be able to have PT eval soon.    HPI HPI: [name] is a 82 yo male who lives at home alone and has been having one month of postprandial nausea. He thought it was due to his chemo pill (Erivedge) he was on for his skin cancer so he stopped this but his nausea has persisted. Nausea is worse after eating. No diarrhea. S/p chole. He has felt bloated. In the last ten days he has also had a significant change in his breathing. His is very sob, has been having chills, does not know if he is running fever. Has been having a productive cough which is nonbloody. No swelling in his legs. He was hospitalized at [facility] 2 weeks ago for the nausea and he was dehydrated, he was told everything looked fine but he was dehydrated he was given some ivf, no abx and sent home. Pt has been given some ivf and oxygen [place] in  the ED and he feels much better. His sob is better. Found to have pna and ileus. He has been on a clear liquid diet since admission. He was advanced to solids this AM, but pt requested change back to clear liquids. SLP asked to evaluate swallow.      SLP Plan  Continue with current plan of care     Recommendations  Diet recommendations: Dysphagia 1 (puree);Thin liquid Liquids provided via: Cup;Straw Medication Administration: Crushed with puree Supervision: Staff to assist with self feeding;Full supervision/cueing for compensatory strategies Compensations: Small sips/bites Postural Changes and/or Swallow Maneuvers: Seated upright 90 degrees;Upright 30-60 min after meal             General recommendations: PT consult Oral Care Recommendations: Oral care BID;Staff/trained caregiver to provide oral care Follow up Recommendations: Skilled Nursing facility Plan: Continue with current plan of care     GO                Vinicio Lynk 12/29/2015, 2:24 PM

## 2015-12-29 NOTE — Progress Notes (Addendum)
Initial Nutrition Assessment  DOCUMENTATION CODES:  Not applicable  INTERVENTION:  Acquired foods that patient reports he is able to tolerate, will place on tray  Followed up with RN/palliative NP about better nausea management  Followed up with RN/NT regarding pt's need for feeding assisstance  NUTRITION DIAGNOSIS:  Inadequate oral intake related to nausea as evidenced by meal completion < 50%, per patient/family report.  GOAL:  Patient will meet greater than or equal to 90% of their needs  MONITOR:  PO intake, Supplement acceptance, I & O's, Labs, Symptom management  REASON FOR ASSESSMENT:  Rounds    ASSESSMENT:  82 y/o male PMHx HLD, bells palsy, CKD 3, HTN, Skin cancer who presents w/ 1 month postprandial nausea thought to be related to chemo pill. Also has been very SOB x 1week. Found to have PNA w/ ileus.   SLP referred RD to pt. At this time, pt's ileus appears to have resolved. Per SLP recommendations, pt has been placed on D1 diet.   On RD arrival, pt seen with green bag on chest. Pt reports that he has "coughed back up" parts of his lunch. He are some of it. The last thing he had was the desert and this did not sit well with him. He says right now he doesn't want anything at all.   This morning, SLP gave pt Ensure over ice and this also came back up ~15 minutes after consuming. Pt says he would have choked on it if he wasn't able to expel it.   Pt reports all of the foods are "too sweet"-thinks this may be contributing to nausea. He thought the ensure was much too sweet and as such Breeze, prostat would not be well tolerated  RD spent a considerable amount of time going through what foods pt has tried, both in hospital and at home, and what he was able to tolerate. He reports intolerance/nausea to just about everything now, even items on the clear liquid diet. The only things he was open to trying were tomato juice and lactaid milk. He consumed these at home, as well as  a "low Kcal Boost supplement". Pt also acknowledged he is scared to eat many foods due to fear of nausea.   Family member at bed also reports that patient requires much assistance with eating and has not been able to receive it. Advised family member and [name] would follow up with RN/CNA  Despite the foods given/medication (zofran/reglan), Pt's nausea continues to not be well controlled . Followed up with nurse and palliative NP at potentially asking/writing orders for interventions  to better control it.   Palliative following pt as well. Per notes, pt planning to go with hospice.   Diet Order:  DIET - DYS 1 Room service appropriate?: Yes; Fluid consistency:: Thin  Skin:  Generalized Abrasions  Last BM:  1/30  Height:  Ht Readings from Last 1 Encounters:  12/24/15 5\' 7"  (1.702 m)   Weight:  Wt Readings from Last 1 Encounters:  12/24/15 161 lb 1.6 oz (73.074 kg)   Wt Readings from Last 10 Encounters:  12/24/15 161 lb 1.6 oz (73.074 kg)  04/27/15 156 lb 9.6 oz (71.033 kg)  11/04/14 156 lb (70.761 kg)  05/04/14 166 lb (75.297 kg)  04/29/14 166 lb (75.297 kg)  04/15/14 167 lb (75.751 kg)  10/29/13 168 lb (76.204 kg)  03/26/13 163 lb 6.4 oz (74.118 kg)  10/25/12 162 lb (73.483 kg)  03/20/12 161 lb 12.8 oz (73.392 kg)  Admit weight: 150 lbs   Ideal Body Weight:  67.27 kg  BMI:  Body mass index is 25.23 kg/(m^2).  Estimated Nutritional Needs:  Kcal:  1500-1700 (22-25 kcal/kg bw) Protein:  65-75 g pro Fluid:  1.6-1.7 liters fluid  EDUCATION NEEDS:  No education needs identified at this time  Burtis Junes RD, LDN Nutrition Pager: [phone] 12/29/2015 1:24 PM

## 2015-12-29 NOTE — Progress Notes (Addendum)
[facility] PROGRESS NOTE  DIONIS SPAR [id number] DOB: 21-May-1920 DOA: 12/24/2015 PCP: Eulas Post, MD    Code Status: DO NOT RESUSCITATE Family Communication: Discussed with son Disposition Plan: Discharge when clinically appropriate   Consultants:  Palliative care  Procedures:  2-D echocardiogram 12/26/15: Study Conclusions - Left ventricle: The cavity size was normal. Systolic function was normal. The estimated ejection fraction was in the range of 55% to 60%. Wall motion was normal; there were no regional wall motion abnormalities. Doppler parameters are consistent with abnormal left ventricular relaxation (grade 1 diastolic dysfunction). - Mitral valve: Calcified annulus. - Right ventricle: The cavity size was normal. Wall thickness was mildly increased. - Pulmonary arteries: Systolic pressure was mildly increased. PA peak pressure: 42 mm Hg (S).  Antibiotics:  Zosyn  Vancomycin, discontinued.  HPI/Subjective: Patient complains of feeling weak. He denies shortness of breath currently. He admits to a poor appetite. He denies abdominal pain.  Objective: Filed Vitals:   12/29/15 0541 12/29/15 1501  BP: 154/60 152/64  Pulse: 92 84  Temp: 98 F (36.7 C) 98.4 F (36.9 C)  Resp: 17 18   oxygen saturation 94%.  Intake/Output Summary (Last 24 hours) at 12/29/15 1833 Last data filed at 12/29/15 1733  Gross per 24 hour  Intake    603 ml  Output    350 ml  Net    253 ml   Filed Weights   12/24/15 1719 12/24/15 2256  Weight: 68.04 kg (150 lb) 73.074 kg (161 lb 1.6 oz)    Exam:   General:  Elderly debilitated appearing 82 year old man in no acute distress.  Cardiovascular: S1, S2, with a soft systolic murmur.  Respiratory: Few crackles auscultated on the right, otherwise decreased breath sounds in the bases and clear anteriorly. Breathing nonlabored at rest.  Abdomen: Bowel sounds present, slightly distended; mild  tenderness in the hypogastrium; no masses palpated.  Musculoskeletal/extremities: No pedal edema. No acute hot red joints.   Data Reviewed: Basic Metabolic Panel:  Recent Labs Lab 12/24/15 1759 12/27/15 0628 12/29/15 0632  NA 142 143 146*  K 4.2 3.8 3.3*  CL 99* 100* 101  CO2 25 30 32  GLUCOSE 116* 94 84  BUN 18 12 13   CREATININE 1.11 1.02 1.10  CALCIUM 9.3 8.9 8.6*   Liver Function Tests:  Recent Labs Lab 12/24/15 1759  AST 22  ALT 20  ALKPHOS 101  BILITOT 1.7*  PROT 6.6  ALBUMIN 3.4*    Recent Labs Lab 12/24/15 1759  LIPASE 19   No results for input(s): AMMONIA in the last 168 hours. CBC:  Recent Labs Lab 12/24/15 1759 12/27/15 0628  WBC 8.9 8.1  NEUTROABS 7.3  --   HGB 12.4* 11.2*  HCT 39.5 35.7*  MCV 94.5 93.5  PLT 167 182   Cardiac Enzymes:  Recent Labs Lab 12/24/15 1759 12/24/15 2236 12/25/15 0353 12/25/15 1035  TROPONINI 0.04* 0.04* 0.05* 0.05*   BNP (last 3 results)  Recent Labs  12/24/15 2236  BNP 29.0    ProBNP (last 3 results) No results for input(s): PROBNP in the last 8760 hours.  CBG: No results for input(s): GLUCAP in the last 168 hours.  Recent Results (from the past 240 hour(s))  Culture, blood (routine x 2) Call MD if unable to obtain prior to antibiotics being given     Status: None   Collection Time: 12/24/15 10:27 PM  Result Value Ref Range Status   Specimen Description BLOOD RIGHT ANTECUBITAL  Final  Special Requests BOTTLES DRAWN AEROBIC ONLY 4CC ONLY  Final   Culture NO GROWTH 5 DAYS  Final   Report Status 12/29/2015 FINAL  Final  Culture, blood (routine x 2) Call MD if unable to obtain prior to antibiotics being given     Status: None   Collection Time: 12/24/15 10:36 PM  Result Value Ref Range Status   Specimen Description BLOOD RIGHT ANTECUBITAL  Final   Special Requests BOTTLES DRAWN AEROBIC ONLY 4CC ONLY  Final   Culture NO GROWTH 5 DAYS  Final   Report Status 12/29/2015 FINAL  Final  MRSA PCR  Screening     Status: None   Collection Time: 12/26/15  8:30 AM  Result Value Ref Range Status   MRSA by PCR NEGATIVE NEGATIVE Final    Comment:        The GeneXpert MRSA Assay (FDA approved for NASAL specimens only), is one component of a comprehensive MRSA colonization surveillance program. It is not intended to diagnose MRSA infection nor to guide or monitor treatment for MRSA infections.      Studies: Dg Abd 1 View  12/28/2015  CLINICAL DATA:  Nausea EXAM: ABDOMEN - 1 VIEW COMPARISON:  12/24/2015 FINDINGS: Scattered large and small bowel gas is noted. The degree of bowel gas has decreased in the interval from the prior exam. No obstructive changes are noted. Aortic calcifications are seen and stable. No free air is noted. IMPRESSION: No acute abnormality noted. The degree of gaseous distention has improved in the interval from the prior exam. Electronically Signed   By: Inez Catalina M.D.   On: 12/28/2015 12:29    Scheduled Meds: . bisacodyl  5 mg Oral QODAY  . docusate sodium  200 mg Oral BID  . enoxaparin (LOVENOX) injection  40 mg Subcutaneous Q24H  . latanoprost  1 drop Both Eyes QHS  . metoCLOPramide (REGLAN) injection  5 mg Intravenous 3 times per day  . ondansetron (ZOFRAN) IV  4 mg Intravenous 3 times per day  . piperacillin-tazobactam (ZOSYN)  IV  3.375 g Intravenous Q8H  . Polyethyl Glycol-Propyl Glycol  1 application Both Eyes Daily  . senna  1 tablet Oral QODAY  . sodium chloride flush  3 mL Intravenous Q12H  . temazepam  7.5 mg Oral QHS   Continuous Infusions:   Principal Problem:   HCAP (healthcare-associated pneumonia) Active Problems:   Essential hypertension   PACEMAKER, PERMANENT-[employer]   CKD (chronic kidney disease) stage 3, GFR 30-59 ml/min   Nausea & vomiting   Elevated troponin   Pleural effusion on right   Ileus [facility])   Palliative care encounter   Dysphagia   Hypernatremia   Hypokalemia  Patient continues to complain of generalized  weakness and poor appetite. Will continue Zosyn for treatment of hospital-acquired pneumonia. Strep pneumo antigen and Legionella antigen were both negative. Speech therapy evaluated the patient and recommended a dysphagia 1 diet with thin liquids. Will add boost as an attritional supplement. Will try changing Reglan and Zofran to by mouth. Will restart IV fluids with potassium chloride added to treat mild hypernatremia and hypokalemia. We'll order PT evaluation to help with mobilization. Appreciate input from palliative care and speech therapy.  Time spent: 30 minutes.    [facility] Pager [phone]. If 7PM-7AM, please contact night-coverage at [email or web address], password [facility] 12/29/2015, 6:33 PM  LOS: 4 days

## 2015-12-30 ENCOUNTER — Inpatient Hospital Stay (HOSPITAL_COMMUNITY): Payer: Medicare Other

## 2015-12-30 DIAGNOSIS — R7989 Other specified abnormal findings of blood chemistry: Secondary | ICD-10-CM

## 2015-12-30 DIAGNOSIS — Z950 Presence of cardiac pacemaker: Secondary | ICD-10-CM

## 2015-12-30 DIAGNOSIS — I10 Essential (primary) hypertension: Secondary | ICD-10-CM

## 2015-12-30 DIAGNOSIS — N183 Chronic kidney disease, stage 3 (moderate): Secondary | ICD-10-CM

## 2015-12-30 LAB — CBC
HCT: 35.2 % — ABNORMAL LOW (ref 39.0–52.0)
HEMOGLOBIN: 10.8 g/dL — AB (ref 13.0–17.0)
MCH: 29 pg (ref 26.0–34.0)
MCHC: 30.7 g/dL (ref 30.0–36.0)
MCV: 94.4 fL (ref 78.0–100.0)
PLATELETS: 175 10*3/uL (ref 150–400)
RBC: 3.73 MIL/uL — AB (ref 4.22–5.81)
RDW: 14.6 % (ref 11.5–15.5)
WBC: 8 10*3/uL (ref 4.0–10.5)

## 2015-12-30 LAB — COMPREHENSIVE METABOLIC PANEL
ALBUMIN: 2.6 g/dL — AB (ref 3.5–5.0)
ALK PHOS: 84 U/L (ref 38–126)
ALT: 16 U/L — AB (ref 17–63)
AST: 23 U/L (ref 15–41)
Anion gap: 9 (ref 5–15)
BUN: 13 mg/dL (ref 6–20)
CALCIUM: 8.6 mg/dL — AB (ref 8.9–10.3)
CHLORIDE: 98 mmol/L — AB (ref 101–111)
CO2: 36 mmol/L — AB (ref 22–32)
CREATININE: 1.09 mg/dL (ref 0.61–1.24)
GFR calc Af Amer: >60 mL/min (ref >60)
GFR calc non Af Amer: 56 mL/min — ABNORMAL LOW (ref >60)
GLUCOSE: 185 mg/dL — AB (ref 65–99)
Potassium: 3 mmol/L — ABNORMAL LOW (ref 3.5–5.1)
SODIUM: 143 mmol/L (ref 135–145)
Total Bilirubin: 0.8 mg/dL (ref 0.3–1.2)
Total Protein: 5.7 g/dL — ABNORMAL LOW (ref 6.5–8.1)

## 2015-12-30 LAB — MAGNESIUM: Magnesium: 1.6 mg/dL — ABNORMAL LOW (ref 1.7–2.4)

## 2015-12-30 MED ORDER — POTASSIUM CHLORIDE 10 MEQ/100ML IV SOLN
10.0000 meq | INTRAVENOUS | Status: AC
  Administered 2015-12-30 (×2): 10 meq via INTRAVENOUS
  Filled 2015-12-30 (×2): qty 100

## 2015-12-30 MED ORDER — METOPROLOL TARTRATE 50 MG PO TABS: 50.0000 mg | ORAL_TABLET | Freq: Two times a day (BID) | ORAL | Status: DC

## 2015-12-30 MED ORDER — SODIUM CHLORIDE 0.45 % IV SOLN
INTRAVENOUS | Status: DC
  Administered 2015-12-30: 11:00:00 via INTRAVENOUS
  Filled 2015-12-30 (×5): qty 1000

## 2015-12-30 MED ORDER — SODIUM CHLORIDE 0.45 % IV SOLN
INTRAVENOUS | Status: DC
Start: 2015-12-30 — End: 2015-12-30
  Administered 2015-12-30: 04:00:00 via INTRAVENOUS

## 2015-12-30 MED ORDER — METOPROLOL TARTRATE 50 MG PO TABS
50.0000 mg | ORAL_TABLET | Freq: Two times a day (BID) | ORAL | Status: DC
  Administered 2015-12-30 – 2015-12-31 (×3): 50 mg via ORAL
  Filled 2015-12-30 (×3): qty 1

## 2015-12-30 NOTE — Progress Notes (Signed)
Daily Progress Note   Patient Name: Shane Simpson       Date: 12/30/2015 DOB: 19-Mar-1920  Age: 82 y.o. MRN#: [id number] Attending Physician: Rexene Alberts, MD Primary Care Physician: Eulas Post, MD Admit Date: 12/24/2015  Reason for Consultation/Follow-up: Disposition, Non pain symptom management and Psychosocial/spiritual support  Subjective: Shane Simpson is sitting up in his chair today.  He makes eye contact and greets me.  We talk about his nausea, he shares that this has improved somewhat but not enough.  We talk about increasing Reglan, but the risks of tardive dyskinesia.  He shares that this would be ok, if his symptoms are improved.  We talk about his diet and he shares that he prefers to have pureed diet.  His son is at bedside, and agrees that Mr. Hext is better able to manage pureed.  He is still considering going to [facility] as private pay with Hospice.  He has been suggested to accept rehab, but he had previously stated he was not interested in participating in rehab.  This may changes as his symptoms are better managed.   Length of Stay: 5 days  Current Medications: Scheduled Meds:  . bisacodyl  5 mg Oral QODAY  . enoxaparin (LOVENOX) injection  40 mg Subcutaneous Q24H  . feeding supplement  1 Container Oral TID BM  . latanoprost  1 drop Both Eyes QHS  . metoCLOPramide  5 mg Oral TID AC & HS  . metoprolol tartrate  50 mg Oral BID  . ondansetron  4 mg Oral TID WC  . Polyethyl Glycol-Propyl Glycol  1 application Both Eyes Daily  . sodium chloride flush  3 mL Intravenous Q12H  . temazepam  7.5 mg Oral QHS    Continuous Infusions: . sodium chloride 0.45 % with kcl 40 mL/hr at 12/30/15 1119    PRN Meds: sodium chloride, sodium chloride flush  Physical  Exam: Physical Exam  Constitutional: He is oriented to person, place, and time.  Thin and frail  HENT:  Head: Normocephalic and atraumatic.  Pulmonary/Chest: Effort normal.  Abdominal: Soft.  Rounded, no guarding.   Neurological: He is alert and oriented to person, place, and time.  Skin: Skin is warm and dry.  Vital Signs: BP 142/68 mmHg  Pulse 94  Temp(Src) 98.2 F (36.8 C) (Oral)  Resp 18  Ht 5\' 7"  (1.702 m)  Wt 73.074 kg (161 lb 1.6 oz)  BMI 25.23 kg/m2  SpO2 96% SpO2: SpO2: 96 % O2 Device: O2 Device: Nasal Cannula O2 Flow Rate: O2 Flow Rate (L/min): 3 L/min  Intake/output summary:  Intake/Output Summary (Last 24 hours) at 12/30/15 1558 Last data filed at 12/29/15 2100  Gross per 24 hour  Intake    170 ml  Output    200 ml  Net    -30 ml   LBM: Last BM Date: 12/30/15 Baseline Weight: Weight: 68.04 kg (150 lb) Most recent weight: Weight: 73.074 kg (161 lb 1.6 oz)       Palliative Assessment/Data: Flowsheet Rows        Most Recent Value   Intake Tab    Referral Department  Hospitalist   Unit at Time of Referral  Med/Surg Unit   Palliative Care Primary Diagnosis  Other (Comment)   Date Notified  12/27/15   Palliative Care Type  New Palliative care   Reason for referral  Clarify Goals of Care, End of Life Care Assistance, Psychosocial or Spiritual support   Date of Admission  12/24/15   Date first seen by Palliative Care  12/27/15   # of days Palliative referral response time  0 Day(s)   # of days IP prior to Palliative referral  3   Clinical Assessment    Palliative Performance Scale Score  30%   Pain Max last 24 hours  0   Pain Min Last 24 hours  0   Dyspnea Max Last 24 Hours  0   Dyspnea Min Last 24 hours  0   Psychosocial & Spiritual Assessment    Social Work Plan of Care  Staff support   Palliative Care Outcomes    Patient/Family meeting held?  Yes   Who was at the meeting?  patient and son [name]   Palliative Care Outcomes  Improved  non-pain symptom therapy, Clarified goals of care, Provided end of life care assistance, Provided advance care planning, Provided psychosocial or spiritual support   Patient/Family wishes: Interventions discontinued/not started   Mechanical Ventilation   Palliative Care follow-up planned  Yes, Facility      Additional Data Reviewed: CBC    Component Value Date/Time   WBC 8.0 12/30/2015 0521   RBC 3.73* 12/30/2015 0521   HGB 10.8* 12/30/2015 0521   HCT 35.2* 12/30/2015 0521   PLT 175 12/30/2015 0521   MCV 94.4 12/30/2015 0521   MCH 29.0 12/30/2015 0521   MCHC 30.7 12/30/2015 0521   RDW 14.6 12/30/2015 0521   LYMPHSABS 0.7 12/24/2015 1759   MONOABS 0.7 12/24/2015 1759   EOSABS 0.1 12/24/2015 1759   BASOSABS 0.0 12/24/2015 1759    CMP     Component Value Date/Time   NA 143 12/30/2015 0050   K 3.0* 12/30/2015 0050   CL 98* 12/30/2015 0050   CO2 36* 12/30/2015 0050   GLUCOSE 185* 12/30/2015 0050   BUN 13 12/30/2015 0050   CREATININE 1.09 12/30/2015 0050   CALCIUM 8.6* 12/30/2015 0050   PROT 5.7* 12/30/2015 0050   ALBUMIN 2.6* 12/30/2015 0050   AST 23 12/30/2015 0050   ALT 16* 12/30/2015 0050   ALKPHOS 84 12/30/2015 0050   BILITOT 0.8 12/30/2015 0050   GFRNONAA 56* 12/30/2015 0050   GFRAA >60 12/30/2015 0050       Problem List:  Patient Active Problem List   Diagnosis Date Noted  . Dysphagia 12/29/2015  . Hypernatremia 12/29/2015  . Hypokalemia 12/29/2015  . Palliative care encounter   . HCAP (healthcare-associated pneumonia) 12/24/2015  . Nausea & vomiting 12/24/2015  . Elevated troponin 12/24/2015  . Ileus ([place]) 12/24/2015  . Uncontrollable vomiting   . Pleural effusion on right   . CKD (chronic kidney disease) stage 3, GFR 30-59 ml/min 11/04/2014  . Macular degeneration 10/25/2012  . Dyspnea on exertion 03/20/2012  . Chest tightness 03/20/2012  . Sinus node dysfunction (HCC)   . PACEMAKER, PERMANENT-[employer] 02/22/2010  . Essential hypertension  08/06/2009  . INGUINAL HERNIA, RIGHT 08/06/2009  . CONSTIPATION 08/06/2009  . URINARY INCONTINENCE 08/06/2009  . SKIN CANCER, HX OF 08/06/2009  . HYPERLIPIDEMIA 07/09/2009  . BELLS PALSY 07/09/2009  . GLUCOMA 07/09/2009  . PSA, INCREASED 07/09/2009  . BENIGN PROSTATIC HYPERTROPHY, HX OF 07/09/2009     Palliative Care Assessment & Plan    1.Code Status:  DNR    Code Status Orders        Start     Ordered   12/24/15 2328  Do not attempt resuscitation (DNR)   Continuous    Question Answer Comment  In the event of cardiac or respiratory ARREST Do not call a "code blue"   In the event of cardiac or respiratory ARREST Do not perform Intubation, CPR, defibrillation or ACLS   In the event of cardiac or respiratory ARREST Use medication by any route, position, wound care, and other measures to relive pain and suffering. May use oxygen, suction and manual treatment of airway obstruction as needed for comfort.      12/24/15 2327    Code Status History    Date Active Date Inactive Code Status Order ID Comments User Context   12/24/2015 10:21 PM 12/24/2015 11:27 PM Full Code [id number]  Phillips Grout, MD ED    Advance Directive Documentation        Most Recent Value   Type of Advance Directive  Healthcare Power of [place], Living will   Pre-existing out of facility DNR order (yellow form or pink MOST form)     "MOST" Form in Place?         2. Goals of Care/Additional Recommendations:  Hospice, symptom management   Limitations on Scope of Treatment: treat the treatable.   Desire for further Chaplaincy support:Not discussed today.   Psycho-social Needs: None at this time.   3. Symptom Management:      1.per hospitalist  4. Palliative Prophylaxis:   Bowel Regimen, Frequent Pain Assessment and Turn Reposition  5. Prognosis: Unable to determine, based on outcomes.   6. Discharge Planning:  [place] with Hospice   Care plan was discussed with nursing  staff, CM, SW, and Dr. Caryn Section on next rounds.   Thank you for allowing the Palliative Medicine Team to assist in the care of this patient.   Time In: 1120 Time Out: 1145 Total Time 25 minutes Prolonged Time Billed  no         Drue Novel, NP  12/30/2015, 3:58 PM  Please contact Palliative Medicine Team phone at [phone] for questions and concerns.

## 2015-12-30 NOTE — Plan of Care (Signed)
Problem: Acute Rehab PT Goals(only PT should resolve) Goal: Pt Will Go Sit To Supine/Side Pt will demonstrate ModI bed mobility supine to sitting edge-of-bed to return to PLOF and to decrease caregiver burden.     Goal: Patient Will Transfer Sit To/From Stand Pt will transfer sit to/from-stand with RW at ModI without loss-of-balance to demonstrate good safety awareness for independent mobility in home.     Goal: Pt Will Ambulate Pt will ambulate with RW at Supervision using a step-through pattern and equal step length for a distance greater than 243ft to demonstrate the ability to perform safe household distance ambulation at discharge.

## 2015-12-30 NOTE — Progress Notes (Signed)
We don't stock 1/2NS40K on the floor, paged the on call MD, changed fluid orders, and ordered potassium check in the morning.

## 2015-12-30 NOTE — Evaluation (Signed)
Physical Therapy Evaluation Patient Details Name: Shane Simpson MRN: [id number] DOB: [birth year] Today's Date: 12/30/2015   History of Present Illness  82yo white male who comes to [facility] after progressive weakness and difficulty breathing at home. Pt found to have HCAP. PMH: PPM, CKD, N/V, dysphagia, illeus. Four weeks prior, pt was fully indep in ADl, and required min-mod assistance for IADL, toleratign community distance AMB s AD. Pt was admitted to [facility] 3 weeks ago (per son), and DC to home with weakness that never resolved.    Clinical Impression  Pt is received semirecumbent in bed upon entry, awake, alert, and willing to participate. Pt reports he is feeling somewhat nauseated still, but is agreeable to attempt OOB mobility. Son enters room and confirms details of history. Pt is A&Ox3 and pleasant. Pt reports zero falls in the last 6 months. Pt strength as screened by MMT is 3+/5 throughout with mildly weak grip strength, but is generally weak: this all corresponds well with global weakness noted during functional mobility assessment. Pt appears to have weakness that is more pronounced on the LLE and LUE which patient denies as a chronic problem. Pt requires heavy physical assistance with bed mobility, tranfers, and ambulation, which is drastically different from his baseline wherein he was previously indep in all mobility and ADL. Pt falls risk is high as evidenced by slow gait speed, poor trunk control, and multiple LOB within session requiring assistance to recover. Pt consistently demonstrating a R lateral lean while standing. Pt remaining on 3L O2 throughout evaluation, remaining at 94% SaO2 or greater with exertion, whereas pt is not on O2 at baseline at home. Patient presenting with impairment of strength, range of motion, balance, oxygen perfusion, and activity tolerance, limiting ability to perform ADL and mobility tasks at  baseline level of function. Patient will benefit from  skilled intervention to address the above impairments and limitations, in order to restore to prior level of function, improve patient safety upon discharge, and to decrease falls risk.       Follow Up Recommendations SNF;Supervision - Intermittent;Supervision for mobility/OOB    Equipment Recommendations  None recommended by PT    Recommendations for Other Services       Precautions / Restrictions Precautions Precautions: Fall Restrictions Weight Bearing Restrictions: No      Mobility  Bed Mobility Overal bed mobility: Needs Assistance Bed Mobility: Supine to Sit     Supine to sit: Mod assist     General bed mobility comments: Uses hands to pull self to EOB, still requires physical assistance with trunk.   Transfers Overall transfer level: Needs assistance Equipment used: Rolling walker (2 wheeled);1 person hand held assist Transfers: Sit to/from [facility] Sit to Stand: Mod assist;From elevated surface Stand pivot transfers: Mod assist       General transfer comment: R lateral leaning.   Ambulation/Gait Ambulation/Gait assistance: Mod assist Ambulation Distance (Feet): 10 Feet Assistive device: Rolling walker (2 wheeled)     Gait velocity interpretation: <1.8 ft/sec, indicative of risk for recurrent falls General Gait Details: very weak, continued R lateral lean, single LOB backwards that requires max A to recover; heavy reliance on BUE on RW. Tachycardic and SaO2 stable.   Stairs            Wheelchair Mobility    Modified Rankin (Stroke Patients Only)       Balance Overall balance assessment: Needs assistance Sitting-balance support: Bilateral upper extremity supported;Feet supported Sitting balance-Leahy Scale: Poor  Standing balance support: Bilateral upper extremity supported Standing balance-Leahy Scale: Fair               High level balance activites: Backward walking               Pertinent  Vitals/Pain Pain Assessment: No/denies pain    Home Living Family/patient expects to be discharged to:: Skilled nursing facility Living Arrangements: Alone Available Help at Discharge: Family;Available PRN/intermittently Type of Home: House       Home Layout: Two level;Able to live on main level with bedroom/bathroom Home Equipment: None      Prior Function Level of Independence: Needs assistance   Gait / Transfers Assistance Needed: indep   ADL's / Homemaking Assistance Needed: some assistance with groceries and heavy housework.         Hand Dominance   Dominant Hand: Right    Extremity/Trunk Assessment   Upper Extremity Assessment: Generalized weakness;Overall [facility] for tasks assessed (moderate weakness L>R; 30y hx of trigger fingers digits 4/5 on L and digit 5 on R. sensation intact.  )           Lower Extremity Assessment: Generalized weakness;Overall WFL for tasks assessed (generqally weak worse on L than R. sensation intact. )      Cervical / Trunk Assessment:  (generalized weakness. )  Communication   Communication: No difficulties  Cognition Arousal/Alertness: Awake/alert Behavior During Therapy: WFL for tasks assessed/performed Overall Cognitive Status: Within Functional Limits for tasks assessed                      General Comments      Exercises Other Exercises Other Exercises: LAQ 1x12 bilat Other Exercises: heel raises seated 1x12 bilat, min resistance Other Exercises: Manually resisted elbow extension x10 Other Exercises: Manually resisted elbow flexion x10      Assessment/Plan    PT Assessment Patient needs continued PT services  PT Diagnosis Difficulty walking;Generalized weakness;Hemiplegia non-dominant side   PT Problem List Decreased strength;Decreased range of motion;Decreased activity tolerance;Decreased balance;Decreased mobility  PT Treatment Interventions DME instruction;Gait training;Stair training;Functional mobility  training;Therapeutic activities;Therapeutic exercise;Balance training;Patient/family education   PT Goals (Current goals can be found in the Care Plan section) Acute Rehab PT Goals Patient Stated Goal: Pt would like to regain his strength and fucntional tolerance.  PT Goal Formulation: With patient Time For Goal Achievement: 01/13/16 Potential to Achieve Goals: Good    Frequency Min 3X/week   Barriers to discharge Inaccessible home environment;Decreased caregiver support      Co-evaluation               End of Session Equipment Utilized During Treatment: Gait belt;Oxygen Activity Tolerance: Patient tolerated treatment well;Patient limited by fatigue;No increased pain Patient left: in chair;with call bell/phone within reach;with family/visitor present Nurse Communication: Mobility status         Time: [id number] PT Time Calculation (min) (ACUTE ONLY): 30 min   Charges:   PT Evaluation $PT Eval Moderate Complexity: 1 Procedure PT Treatments $Therapeutic Exercise: 8-22 mins   PT G Codes:        11:23 AM, 01-14-2016 Etta Grandchild, PT, DPT PRN Physical Therapist at [place] License # [id number] (wireless)  [phone] (mobile)

## 2015-12-30 NOTE — Progress Notes (Signed)
Patient has started to have diarrhea, will pass on to day RN to make Dr Caryn Section aware, as to do any further testing.

## 2015-12-30 NOTE — Progress Notes (Signed)
[facility] PROGRESS NOTE  Shane Simpson [id number] DOB: 1920-10-01 DOA: 12/24/2015 PCP: Shane Post, MD    Code Status: DO NOT RESUSCITATE Family Communication: Discussed with son , Shane Simpson Disposition Plan: Discharge when clinically appropriate   Consultants:  Palliative care  Procedures:  2-D echocardiogram 12/26/15: Study Conclusions - Left ventricle: The cavity size was normal. Systolic function was normal. The estimated ejection fraction was in the range of 55% to 60%. Wall motion was normal; there were no regional wall motion abnormalities. Doppler parameters are consistent with abnormal left ventricular relaxation (grade 1 diastolic dysfunction). - Mitral valve: Calcified annulus. - Right ventricle: The cavity size was normal. Wall thickness was mildly increased. - Pulmonary arteries: Systolic pressure was mildly increased. PA peak pressure: 42 mm Hg (S).  Antibiotics:  Zosyn>> discontinued 12/30/15.  Vancomycin, discontinued.  HPI/Subjective: Patient says that he is feeling a little better today. Nursing reports 3 loose stools overnight and one this morning. He has a history of constipation.  Objective: Filed Vitals:   12/30/15 1103 12/30/15 1154  BP:  130/53  Pulse: 122 88  Temp:    Resp:     temperature 97.8. oxygen saturation 94%.  Intake/Output Summary (Last 24 hours) at 12/30/15 1216 Last data filed at 12/29/15 2100  Gross per 24 hour  Intake    300 ml  Output    200 ml  Net    100 ml   Filed Weights   12/24/15 1719 12/24/15 2256  Weight: 68.04 kg (150 lb) 73.074 kg (161 lb 1.6 oz)    Exam:   General:  Elderly debilitated appearing 82 year old man in no acute distress. He is sitting up in the chair.  Cardiovascular: S1, S2, with a soft systolic murmur.  Respiratory: Few crackles auscultated on the right, otherwise decreased breath sounds in the bases and clear anteriorly. Breathing nonlabored at  rest.  Abdomen: Bowel sounds present, slightly distended; mild tenderness in the hypogastrium; no masses palpated.  Musculoskeletal/extremities: No pedal edema. No acute hot red joints.   Data Reviewed: Basic Metabolic Panel:  Recent Labs Lab 12/24/15 1759 12/27/15 0628 12/29/15 0632 12/30/15 0050  NA 142 143 146* 143  K 4.2 3.8 3.3* 3.0*  CL 99* 100* 101 98*  CO2 25 30 32 36*  GLUCOSE 116* 94 84 185*  BUN 18 12 13 13   CREATININE 1.11 1.02 1.10 1.09  CALCIUM 9.3 8.9 8.6* 8.6*   Liver Function Tests:  Recent Labs Lab 12/24/15 1759 12/30/15 0050  AST 22 23  ALT 20 16*  ALKPHOS 101 84  BILITOT 1.7* 0.8  PROT 6.6 5.7*  ALBUMIN 3.4* 2.6*    Recent Labs Lab 12/24/15 1759  LIPASE 19   No results for input(s): AMMONIA in the last 168 hours. CBC:  Recent Labs Lab 12/24/15 1759 12/27/15 0628 12/30/15 0521  WBC 8.9 8.1 8.0  NEUTROABS 7.3  --   --   HGB 12.4* 11.2* 10.8*  HCT 39.5 35.7* 35.2*  MCV 94.5 93.5 94.4  PLT 167 182 175   Cardiac Enzymes:  Recent Labs Lab 12/24/15 1759 12/24/15 2236 12/25/15 0353 12/25/15 1035  TROPONINI 0.04* 0.04* 0.05* 0.05*   BNP (last 3 results)  Recent Labs  12/24/15 2236  BNP 29.0    ProBNP (last 3 results) No results for input(s): PROBNP in the last 8760 hours.  CBG: No results for input(s): GLUCAP in the last 168 hours.  Recent Results (from the past 240 hour(s))  Culture, blood (routine x 2) Call  MD if unable to obtain prior to antibiotics being given     Status: None   Collection Time: 12/24/15 10:27 PM  Result Value Ref Range Status   Specimen Description BLOOD RIGHT ANTECUBITAL  Final   Special Requests BOTTLES DRAWN AEROBIC ONLY 4CC ONLY  Final   Culture NO GROWTH 5 DAYS  Final   Report Status 12/29/2015 FINAL  Final  Culture, blood (routine x 2) Call MD if unable to obtain prior to antibiotics being given     Status: None   Collection Time: 12/24/15 10:36 PM  Result Value Ref Range Status    Specimen Description BLOOD RIGHT ANTECUBITAL  Final   Special Requests BOTTLES DRAWN AEROBIC ONLY 4CC ONLY  Final   Culture NO GROWTH 5 DAYS  Final   Report Status 12/29/2015 FINAL  Final  MRSA PCR Screening     Status: None   Collection Time: 12/26/15  8:30 AM  Result Value Ref Range Status   MRSA by PCR NEGATIVE NEGATIVE Final    Comment:        The GeneXpert MRSA Assay (FDA approved for NASAL specimens only), is one component of a comprehensive MRSA colonization surveillance program. It is not intended to diagnose MRSA infection nor to guide or monitor treatment for MRSA infections.      Studies: Dg Abd 1 View  12/28/2015  CLINICAL DATA:  Nausea EXAM: ABDOMEN - 1 VIEW COMPARISON:  12/24/2015 FINDINGS: Scattered large and small bowel gas is noted. The degree of bowel gas has decreased in the interval from the prior exam. No obstructive changes are noted. Aortic calcifications are seen and stable. No free air is noted. IMPRESSION: No acute abnormality noted. The degree of gaseous distention has improved in the interval from the prior exam. Electronically Signed   By: Shane Simpson M.D.   On: 12/28/2015 12:29    Scheduled Meds: . bisacodyl  5 mg Oral QODAY  . enoxaparin (LOVENOX) injection  40 mg Subcutaneous Q24H  . feeding supplement  1 Container Oral TID BM  . latanoprost  1 drop Both Eyes QHS  . metoCLOPramide  5 mg Oral TID AC & HS  . metoprolol tartrate  50 mg Oral BID  . ondansetron  4 mg Oral TID WC  . piperacillin-tazobactam (ZOSYN)  IV  3.375 g Intravenous Q8H  . Polyethyl Glycol-Propyl Glycol  1 application Both Eyes Daily  . sodium chloride flush  3 mL Intravenous Q12H  . temazepam  7.5 mg Oral QHS   Continuous Infusions: . sodium chloride 0.45 % with kcl 70 mL/hr at 12/30/15 1118   Assessment and plan:  Principal Problem:   HCAP (healthcare-associated pneumonia) Active Problems:   Essential hypertension   PACEMAKER, PERMANENT-[employer]   CKD (chronic  kidney disease) stage 3, GFR 30-59 ml/min   Nausea & vomiting   Elevated troponin   Pleural effusion on right   Ileus [facility])   Palliative care encounter   Dysphagia   Hypernatremia   Hypokalemia  Brief history of present illness:  Patient is a 82 year old man who lives alone and who presented to the ED on 12/24/15 with a chief complaint of generalized weakness , nausea, and shortness of breath. He has had postprandial nausea for more than one month, attributed to his chemotherapeutic agent which can cause nausea. He was hospitalized at [facility] 3 weeks ago for nausea and dehydration -apparently he was given IV fluids and discharged to home.  On presentation in our ED, his chest x-ray  was indicative of pneumonia. He was admitted for further evaluation and management.   1. Healthcare associated pneumonia.  Blood culture and sputum cultures were ordered. He was started on supplemental oxygen. Broad-spectrum antibiotic treatment was started with vancomycin and Zosyn. Blood cultures have remained negative to date. Sputum culture was sent, but results are pending. Strep pneumo and Legionella antigens were both negative. Vancomycin discontinued several days ago As his MRSA screen was negative. - His white blood cell count is normal and he is afebrile.  Will tentatively discontinue Zosyn today.  -Chest x-ray ordered and is pending.  Elevated troponin I.  Patient's troponin I was minimally elevated on admission. There were no significant EKG changes. 2-D echocardiogram ordered and revealed no regional wall motion abnormalities and with an EF of 55-60%. He denied chest pain.  Subacute /persistent nausea and ileus and dysphasia  The patient complained of nausea for more than one month. It was attributed to his chemotherapeutic agent for treatment of skin cancer. He was started on gentle IV fluids. IV Reglan and Zofran were given scheduled. Further evaluation revealed an ileus per abdominal x-ray.  His diet was advanced to clear liquids.  -Speech therapist evaluated him in noted some dysphagia. Dysphagia 1 diet was started which he is slowly eating some of.  -His nausea has subsided. Reglan and Zofran has been changed to the oral route.  -Follow-up abdominal x-ray reveals decreased gaseous distention.  Electrolyte abnormalities : hypernatremia and hypokalemia. Patient's serum sodium increased to 146 and his serum sodium decreased to 130.  -IV fluids changed to half-normal saline with potassium chloride added. We'll try small dose of potassium chloride orally if tolerated.  -We'll check a magnesium level.  Hypertension.  Patient is treated with metoprolol chronically. It was held following admission.   It was restarted on 2/2 as his blood pressure and heart rate are trending up.  Stage III chronic kidney disease.  Currently stable.  Loose stools. Patient has had several loose stools overnight into this morning. He had been given senna and Dulcolax daily. They were discontinued.  Generalized weakness /deconditioning /chronic debilitation. Palliative care was consulted and is following the patient. He is a DO NOT RESUSCITATE. Physical therapy was consulted and recommended SNF placement. Patient appears to be tentatively in agreement with placement.    Time spent: 30 minutes.    [facility] Pager [phone]. If 7PM-7AM, please contact night-coverage at [email or web address], password [facility] 12/30/2015, 12:16 PM  LOS: 5 days

## 2015-12-31 ENCOUNTER — Inpatient Hospital Stay (HOSPITAL_COMMUNITY): Payer: Medicare Other

## 2015-12-31 ENCOUNTER — Telehealth: Payer: Self-pay | Admitting: Internal Medicine

## 2015-12-31 DIAGNOSIS — J948 Other specified pleural conditions: Secondary | ICD-10-CM

## 2015-12-31 LAB — BASIC METABOLIC PANEL
Anion gap: 8 (ref 5–15)
BUN: 12 mg/dL (ref 6–20)
CHLORIDE: 99 mmol/L — AB (ref 101–111)
CO2: 36 mmol/L — ABNORMAL HIGH (ref 22–32)
Calcium: 8.5 mg/dL — ABNORMAL LOW (ref 8.9–10.3)
Creatinine, Ser: 0.97 mg/dL (ref 0.61–1.24)
GFR calc Af Amer: >60 mL/min (ref >60)
GFR calc non Af Amer: >60 mL/min (ref >60)
GLUCOSE: 99 mg/dL (ref 65–99)
POTASSIUM: 3.8 mmol/L (ref 3.5–5.1)
Sodium: 143 mmol/L (ref 135–145)

## 2015-12-31 LAB — GLUCOSE, SEROUS FLUID: Glucose, Fluid: 95 mg/dL

## 2015-12-31 LAB — BODY FLUID CELL COUNT WITH DIFFERENTIAL
EOS FL: 2 %
LYMPHS FL: 86 %
MONOCYTE-MACROPHAGE-SEROUS FLUID: 2 % — AB (ref 50–90)
NEUTROPHIL FLUID: 10 % (ref 0–25)
OTHER CELLS FL: NONE SEEN %
WBC FLUID: 224 uL (ref 0–1000)

## 2015-12-31 LAB — PROTEIN, TOTAL: Total Protein: 5.6 g/dL — ABNORMAL LOW (ref 6.5–8.1)

## 2015-12-31 LAB — LACTATE DEHYDROGENASE, PLEURAL OR PERITONEAL FLUID: LD, Fluid: 214 U/L — ABNORMAL HIGH (ref 3–23)

## 2015-12-31 LAB — LACTATE DEHYDROGENASE: LDH: 193 U/L — ABNORMAL HIGH (ref 98–192)

## 2015-12-31 LAB — PROTEIN, BODY FLUID: TOTAL PROTEIN, FLUID: 2.8 g/dL

## 2015-12-31 MED ORDER — TEMAZEPAM 7.5 MG PO CAPS: 7.5000 mg | ORAL_CAPSULE | Freq: Every day | ORAL | Status: AC

## 2015-12-31 MED ORDER — GLUCERNA SHAKE PO LIQD: 237.0000 mL | Freq: Two times a day (BID) | ORAL | Status: AC

## 2015-12-31 MED ORDER — METOPROLOL TARTRATE 25 MG PO TABS: 25.0000 mg | ORAL_TABLET | Freq: Two times a day (BID) | ORAL | Status: AC

## 2015-12-31 MED ORDER — ONDANSETRON 4 MG PO TBDP: 4.0000 mg | ORAL_TABLET | Freq: Three times a day (TID) | ORAL | Status: AC

## 2015-12-31 MED ORDER — BISACODYL 5 MG PO TBEC: 5.0000 mg | DELAYED_RELEASE_TABLET | Freq: Every day | ORAL | Status: AC | PRN

## 2015-12-31 MED ORDER — METOCLOPRAMIDE HCL 5 MG PO TABS: 5.0000 mg | ORAL_TABLET | Freq: Three times a day (TID) | ORAL | Status: AC

## 2015-12-31 MED ORDER — GLUCERNA SHAKE PO LIQD: 237.0000 mL | Freq: Two times a day (BID) | ORAL | Status: DC

## 2015-12-31 NOTE — Care Management Important Message (Signed)
Important Message  Patient Details  Name: Shane Simpson MRN: [id number] Date of Birth: [birth year]   Medicare Important Message Given:  Yes    Alvie Heidelberg, RN 12/31/2015, 8:46 AM

## 2015-12-31 NOTE — Clinical Social Work Note (Signed)
Clinical Social Work Assessment  Patient Details  Name: Shane Simpson MRN: [id number] Date of Birth: Jun 09, 1920  Date of referral:  12/31/15               Reason for consult:  Facility Placement                Permission sought to share information with:  Family Supports Permission granted to share information::  No  Name::      (sonRonalee Belts at bedside)  Agency::     Relationship::     Contact Information:     Housing/Transportation Living arrangements for the past 2 months:  Single Family Home Source of Information:  Patient, Adult Children Patient Interpreter Needed:  None Criminal Activity/Legal Involvement Pertinent to Current Situation/Hospitalization:  No - Comment as needed Significant Relationships:  Adult Children, Community Support Lives with:  Adult Children Do you feel safe going back to the place where you live?  No Need for family participation in patient care:  Yes (Comment)  Care giving concerns:  Placement   Social Worker assessment / plan:  CSW met with patient and his sonRonalee Belts at bedside. Patient wants to go to [facility] under hospice care- he does not want to pursue rehab and understands and his willing to pay out of pocket. CSW has confirmed a bed for him.  Employment status:  Retired Forensic scientist:  [employer] PT Recommendations:  [place] / Referral to community resources:  Early  Patient/Family's Response to care:  Pleased and appreciative  Patient/Family's Understanding of and Emotional Response to Diagnosis, Current Treatment, and Prognosis:  Good understanding  Emotional Assessment Appearance:  Developmentally appropriate Attitude/Demeanor/Rapport:    Affect (typically observed):  Accepting Orientation:  Oriented to Self, Oriented to Place, Oriented to  Time, Oriented to Situation Alcohol / Substance use:  Never Used Psych involvement (Current and /or in the community):  No  (Comment)  Discharge Needs  Concerns to be addressed:  No discharge needs identified Readmission within the last 30 days:  No Current discharge risk:  Physical Impairment, Chronically ill Barriers to Discharge:  No Barriers Identified   Ludwig Clarks, LCSW 12/31/2015, 10:59 AM

## 2015-12-31 NOTE — NC FL2 (Deleted)
[place] LEVEL OF CARE SCREENING TOOL     IDENTIFICATION  Patient Name: Shane Simpson Birthdate: May 30, 1920 Sex: male Admission Date (Current Location): 12/24/2015  [facility] and [place] Number:  [employer] and Address:  [facility] [address] [place]      Provider Number: [id number]  Attending Physician Name and Address:  Rexene Alberts, MD  Relative Name and Phone Number:       Current Level of Care: Hospital Recommended Level of Care: [place] Prior Approval Number:    Date Approved/Denied:   PASRR Number:    Discharge Plan: SNF    Current Diagnoses: Patient Active Problem List   Diagnosis Date Noted  . Dysphagia 12/29/2015  . Hypernatremia 12/29/2015  . Hypokalemia 12/29/2015  . Palliative care encounter   . HCAP (healthcare-associated pneumonia) 12/24/2015  . Nausea & vomiting 12/24/2015  . Elevated troponin 12/24/2015  . Ileus ([place]) 12/24/2015  . Uncontrollable vomiting   . Pleural effusion on right   . CKD (chronic kidney disease) stage 3, GFR 30-59 ml/min 11/04/2014  . Macular degeneration 10/25/2012  . Dyspnea on exertion 03/20/2012  . Chest tightness 03/20/2012  . Sinus node dysfunction (HCC)   . PACEMAKER, PERMANENT-[employer] 02/22/2010  . Essential hypertension 08/06/2009  . INGUINAL HERNIA, RIGHT 08/06/2009  . CONSTIPATION 08/06/2009  . URINARY INCONTINENCE 08/06/2009  . SKIN CANCER, HX OF 08/06/2009  . HYPERLIPIDEMIA 07/09/2009  . BELLS PALSY 07/09/2009  . GLUCOMA 07/09/2009  . PSA, INCREASED 07/09/2009  . BENIGN PROSTATIC HYPERTROPHY, HX OF 07/09/2009    Orientation RESPIRATION BLADDER Height & Weight     Self, Time, Situation, Place  Normal Continent Weight: 161 lb 1.6 oz (73.074 kg) Height:  5\' 7"  (170.2 cm)  BEHAVIORAL SYMPTOMS/MOOD NEUROLOGICAL BOWEL NUTRITION STATUS      Continent Diet (DYS 1)  AMBULATORY STATUS COMMUNICATION OF NEEDS Skin   Limited Assist  Verbally Normal                       Personal Care Assistance Level of Assistance  Bathing, Dressing Bathing Assistance: Limited assistance   Dressing Assistance: Limited assistance     Functional Limitations Info             SPECIAL CARE FACTORS FREQUENCY                       Contractures      Additional Factors Info  Code Status, Allergies               Current Medications (12/31/2015):  This is the current hospital active medication list Current Facility-Administered Medications  Medication Dose Route Frequency Provider Last Rate Last Dose  . 0.9 %  sodium chloride infusion  250 mL Intravenous PRN Erline Hau, MD      . bisacodyl (DULCOLAX) EC tablet 5 mg  5 mg Oral QODAY Phillips Grout, MD   5 mg at 12/29/15 1102  . enoxaparin (LOVENOX) injection 40 mg  40 mg Subcutaneous Q24H Phillips Grout, MD   40 mg at 12/30/15 2221  . feeding supplement (BOOST / RESOURCE BREEZE) liquid 1 Container  1 Container Oral TID BM Rexene Alberts, MD   1 Container at 12/30/15 2014  . latanoprost (XALATAN) 0.005 % ophthalmic solution 1 drop  1 drop Both Eyes QHS Phillips Grout, MD   1 drop at 12/30/15 2234  . metoCLOPramide (REGLAN) tablet  5 mg  5 mg Oral TID AC & HS Rexene Alberts, MD   5 mg at 12/30/15 2220  . metoprolol (LOPRESSOR) tablet 50 mg  50 mg Oral BID Rexene Alberts, MD   50 mg at 12/30/15 2220  . ondansetron (ZOFRAN-ODT) disintegrating tablet 4 mg  4 mg Oral TID WC Rexene Alberts, MD   4 mg at 12/30/15 1647  . polyethylene glycol 0.4% and propylene glycol 0.3% (SYSTANE) ophthalmic gel  1 application Both Eyes Daily Estela Leonie Green, MD   1 application at [id number]  . sodium chloride 0.45 % 1,000 mL with potassium chloride 40 mEq infusion   Intravenous Continuous Rexene Alberts, MD 40 mL/hr at 12/30/15 1119    . sodium chloride flush (NS) 0.9 % injection 3 mL  3 mL Intravenous Q12H Estela Leonie Green, MD   3 mL at 12/30/15 0850  . sodium  chloride flush (NS) 0.9 % injection 3 mL  3 mL Intravenous PRN Erline Hau, MD   3 mL at 12/29/15 1252  . temazepam (RESTORIL) capsule 7.5 mg  7.5 mg Oral QHS Drue Novel, NP   7.5 mg at 12/30/15 2220     Discharge Medications: Please see discharge summary for a list of discharge medications.  Relevant Imaging Results:  Relevant Lab Results:   Additional Information SSN [id number]  Ludwig Clarks, LCSW

## 2015-12-31 NOTE — Progress Notes (Signed)
Nutrition Follow-up  DOCUMENTATION CODES:  Not applicable  INTERVENTION:  Order foods that pt should hopefully tolerate  Glucerna Shake po BID over ice or with 25% water, each supplement provides 220 kcal and 10 grams of protein  NUTRITION DIAGNOSIS:  Inadequate oral intake related to nausea as evidenced by meal completion < 50%, per patient/family report.  Ongoing  GOAL:  Patient will meet greater than or equal to 90% of their needs  MONITOR:  PO intake, Supplement acceptance, I & O's, Labs, Symptom management  ASSESSMENT:  82 y/o male PMHx HLD, bells palsy, CKD 3, HTN, Skin cancer who presents w/ 1 month postprandial nausea thought to be related to chemo pill. Also has been very SOB x 1week. Found to have PNA w/ ileus.   Per meal documentation, pt still has had very poor PO intake. There is once documented instance of 100% Ensure intake. His meal intake has varied between 10-40%. No intake documented yesterday  Pt reports that his nausea has slightly improved but has not resolved. He reports he ate 50% of his breakfast.He said he ate all of his oatmeal. He was agreeable to [facility] for Dinner if still admitted. He enjoyed/was able to tolerate the gravy as well.    Unfortunately, he did not receive any lactaid milk and the tomato juice that [facility] carries is V8 which is not what he was hoping for. Pt was given Boost Breeze though it was too sweet as was anticipated as he has a very low tolerance to sweetness; he took a couple sips. Will d/c this. Can try watered down Glucerna.   Pt and son report that pt will likely d/c this afternoon or early tomorrow.   Palliative following pt as well. Per notes, pt planning to go with hospice.   Diet Order:  DIET - DYS 1 Room service appropriate?: Yes; Fluid consistency:: Thin  Skin:  Generalized Abrasions, dry/flaky  Last BM:  2/2  Height:  Ht Readings from Last 1 Encounters:  12/24/15 5\' 7"  (1.702 m)   Weight:  Wt Readings from Last 1  Encounters:  12/24/15 161 lb 1.6 oz (73.074 kg)   Wt Readings from Last 10 Encounters:  12/24/15 161 lb 1.6 oz (73.074 kg)  04/27/15 156 lb 9.6 oz (71.033 kg)  11/04/14 156 lb (70.761 kg)  05/04/14 166 lb (75.297 kg)  04/29/14 166 lb (75.297 kg)  04/15/14 167 lb (75.751 kg)  10/29/13 168 lb (76.204 kg)  03/26/13 163 lb 6.4 oz (74.118 kg)  10/25/12 162 lb (73.483 kg)  03/20/12 161 lb 12.8 oz (73.392 kg)   Admit weight: 150 lbs   Ideal Body Weight:  67.27 kg  BMI:  Body mass index is 25.23 kg/(m^2).  Estimated Nutritional Needs:  Kcal:  1500-1700 (22-25 kcal/kg bw) Protein:  65-75 g pro Fluid:  1.6-1.7 liters fluid  EDUCATION NEEDS:  No education needs identified at this time  Burtis Junes RD, LDN Nutrition Pager: [phone] 12/31/2015 10:36 AM

## 2015-12-31 NOTE — Progress Notes (Signed)
I did not ask radiology to place an order for the thorancentesis.  I talked to someone in u/s to inquire when the patients test would be done since the MD and SW was inquiring when the test would be done since he would be d/c'd after the test.  The U/S tech stated that she did not see an order but I could see the order placement from my side.  Voiced to her I would re order the test and she stated they would be up within the hour. I verbalized understanding.

## 2015-12-31 NOTE — Clinical Social Work Placement (Signed)
   CLINICAL SOCIAL WORK PLACEMENT  NOTE  Date:  12/31/2015  Patient Details  Name: Shane Simpson MRN: [id number] Date of Birth: [birth year]  Clinical Social Work is seeking post-discharge placement for this patient at the [place] level of care (*CSW will initial, date and re-position this form in  chart as items are completed):  Yes   Patient/family provided with [facility] Work Department's list of facilities offering this level of care within the geographic area requested by the patient (or if unable, by the patient's family).  Yes   Patient/family informed of their freedom to choose among providers that offer the needed level of care, that participate in Medicare, Medicaid or managed care program needed by the patient, have an available bed and are willing to accept the patient.  Yes   Patient/family informed of 's ownership interest in [facility] and [facility], as well as of the fact that they are under no obligation to receive care at these facilities.  PASRR submitted to EDS on 12/31/15     PASRR number received on 12/31/15     Existing PASRR number confirmed on       FL2 transmitted to all facilities in geographic area requested by pt/family on 12/31/15     FL2 transmitted to all facilities within larger geographic area on       Patient informed that his/her managed care company has contracts with or will negotiate with certain facilities, including the following:            Patient/family informed of bed offers received.  Patient chooses bed at  [facility])     Physician recommends and patient chooses bed at      Patient to be transferred to   on  .  Patient to be transferred to facility by       Patient family notified on   of transfer.  Name of family member notified:        PHYSICIAN Please sign FL2, Please prepare priority discharge summary, including medications     Additional Comment:     _______________________________________________ Ludwig Clarks, LCSW 12/31/2015, 10:55 AM

## 2015-12-31 NOTE — Progress Notes (Signed)
Patient for d/c today to SNF bed at  [facility]- plans to pay privately and be under hospice care. Son and patient agreeable to this plan- plan transfer via EMS. Eduard Clos, MSW, [place]

## 2015-12-31 NOTE — Discharge Summary (Addendum)
Physician Discharge Summary  TRAVAN RAMASWAMY [id number] DOB: June 25, 1920 DOA: 12/24/2015  PCP: Eulas Post, MD  Admit date: 12/24/2015 Discharge date: 12/31/2015  Time spent: Greater than 30 minutes  Recommendations for Outpatient Follow-up:  1. Patient is being discharged to [facility] skilled nursing facility where Hospice should follow him there.  2. Pleural fluid analysis results were pending at the time of discharge, but the thoracentesis was performed primarily for palliative reasons. Would recommend that the SNF M.D. check the results of the pleural fluid results and if it is a transudate, would recommend consideration for starting a short course of oral Lasix. I do not believe another course of antibiotics would be needed if it is mildly transudative unless the culture is positive, as the patient had already completed a 7 day course of Zosyn.     Discharge Diagnoses:  1. DO NOT RESUSCITATE status with transition to hospice care. 2. Subacute to chronic nausea with vomiting secondary to chemotherapeutic agent and ileus. 3. Healthcare associated pneumonia. 4. Right pleural effusion, likely parapneumonic. 5. Grade 1 diastolic dysfunction and mild pulmonary hypertension per echo. EF was 55-60%. 6. Elevated troponin I, secondary to demand ischemia from pneumonia. 7. Pacemaker in situ. 8. Stage III chronic kidney disease. 9. Electrolyte abnormalities including hypernatremia and hypokalemia. 10. Essential hypertension. 11. Dysphagia.   Discharge Condition: Stable but chronically ill  Diet recommendation: Pured or finely chopped foods with thin liquids.  Filed Weights   12/24/15 1719 12/24/15 2256  Weight: 68.04 kg (150 lb) 73.074 kg (161 lb 1.6 oz)    History of present illness:  Patient is a 82 year old man who lived alone and who presented to the ED on 12/24/15 with a chief complaint of generalized weakness , nausea, and shortness of breath. He has had postprandial  nausea for more than one month, attributed to his chemotherapeutic agent which can cause nausea. He was hospitalized at [facility] 3 weeks ago for nausea and dehydration -apparently he was given IV fluids and discharged to home. On presentation in our ED, his chest x-ray was indicative of pneumonia. He was admitted for further evaluation and management.   Hospital Course:  1. Healthcare associated pneumonia. Blood culture and sputum cultures were ordered on admission. He was started on supplemental oxygen. Broad-spectrum antibiotic treatment was started with vancomycin and Zosyn. Blood cultures have remained negative to date. Sputum culture was sent, but results were pending at the time of discharge. Strep pneumo and Legionella antigens were both negative. Vancomycin was discontinued several days ago as his MRSA screen was negative. - His white blood cell count has normalized normal and has been afebrile. Zosyn was discontinued after 7 days of treatment. -Follow-up chest x-ray was ordered on 12/30/15 and revealed a large right pleural effusion.  Large right pleural effusion. Patient's follow-up chest x-ray revealed large right pleural effusion. This was felt to be secondary to a parapneumonic effusion that fluffed out over the course of the hospitalization. Primarily for therapeutic reasons, he underwent an ultrasound-guided thoracentesis on 12/31/15. It yielded 1200 mL's of amber-colored fluid. A sample was sent for analysis. Results currently pending. Patient tolerated procedure well.  Elevated troponin I. Patient's troponin I was minimally elevated on admission. There were no significant EKG changes. 2-D echocardiogram ordered and revealed no regional wall motion abnormalities and with an EF of 55-60%. He denied chest pain.  Subacute /persistent nausea and ileus and dysphasia The patient complained of nausea for more than one month. It was attributed to his chemotherapeutic  agent for  treatment of skin cancer. He was started on gentle IV fluids. IV Reglan and Zofran were given scheduled. Further evaluation revealed an ileus per abdominal x-ray. His diet was advanced to clear liquids. -Speech therapist evaluated him and noted some dysphagia. Dysphagia 1 diet was started. He is starting to eat it better. -His nausea has subsided. Reglan and Zofran were changed to the oral/sublingual route. -Follow-up abdominal x-ray revealed decreased gaseous distention. -His diet can be advanced or changed per tolerance. Consider follow-up speech therapy evaluation at the skilled nursing facility.  Electrolyte abnormalities : hypernatremia and hypokalemia. Patient's serum sodium increased to 146 and his serum potassium decreased to 3.0 -IV fluids were changed to half-normal saline with potassium chloride added. His serum sodium improved to 143 and his potassium improved to 3.8.  Hypertension. Patient is treated with metoprolol chronically. It was held following admission. It was restarted on 2/2 at a lower dose of 25 mg twice a day as his blood pressure was starting to trend up.  Stage III chronic kidney disease. His renal function has been stable or improved over the course of the hospitalization.  Loose stools. Patient has had several loose stools. He had been given senna and Dulcolax daily. They were discontinued, but Senokot was restarted daily upon discharge.  Generalized weakness /deconditioning /chronic debilitation. Palliative care was consulted and followed the patient and made recommendations. He was made a DO NOT RESUSCITATE.  Patient told the palliative care nurse practitioner, missed of, that he was ready to die. Patient wanted to be discharged to a skilled nursing facility and requested that hospice follow him there. He had no interest in participating in physical therapy.    Procedures:  Thoracentesis on 12/31/15  2-D echocardiogram 12/26/15: Study Conclusions -  Left ventricle: The cavity size was normal. Systolic function was normal. The estimated ejection fraction was in the range of 55% to 60%. Wall motion was normal; there were no regional wall motion abnormalities. Doppler parameters are consistent with abnormal left ventricular relaxation (grade 1 diastolic dysfunction). - Mitral valve: Calcified annulus. - Right ventricle: The cavity size was normal. Wall thickness was mildly increased. - Pulmonary arteries: Systolic pressure was mildly increased. PA peak pressure: 42 mm Hg (S).  Consultations:  Palliative care  Discharge Exam: Filed Vitals:   12/31/15 1345 12/31/15 1407  BP: 149/63 127/50  Pulse: 86 87  Temp:    Resp: 20 20    General: Elderly debilitated appearing 82 year old man in no acute distress. He says that he feels better and ate some of his breakfast this morning.   Cardiovascular: S1, S2, with a soft systolic murmur.  Respiratory: Few crackles auscultated on the right, otherwise decreased breath sounds in the bases and clear anteriorly. Breathing nonlabored at rest. Oxygen saturation 95% on nasal cannula oxygen.  Abdomen: Bowel sounds present, less distended; mild tenderness in the hypogastrium; no masses palpated.  Musculoskeletal/extremities: No pedal edema. No acute hot red joints.   Discharge Instructions    Current Discharge Medication List    START taking these medications   Details  feeding supplement, GLUCERNA SHAKE, (GLUCERNA SHAKE) LIQD Take 237 mLs by mouth 2 (two) times daily between meals.    metoCLOPramide (REGLAN) 5 MG tablet Take 1 tablet (5 mg total) by mouth 4 (four) times daily -  before meals and at bedtime.    ondansetron (ZOFRAN-ODT) 4 MG disintegrating tablet Take 1 tablet (4 mg total) by mouth 3 (three) times daily with meals. Qty: 90 tablet,  Refills: 0    temazepam (RESTORIL) 7.5 MG capsule Take 1 capsule (7.5 mg total) by mouth at bedtime. Qty: 30 capsule,  Refills: 0      CONTINUE these medications which have CHANGED   Details  bisacodyl (DULCOLAX) 5 MG EC tablet Take 1 tablet (5 mg total) by mouth daily as needed for moderate constipation.    metoprolol (LOPRESSOR) 25 MG tablet Take 1 tablet (25 mg total) by mouth 2 (two) times daily.      CONTINUE these medications which have NOT CHANGED   Details  latanoprost (XALATAN) 0.005 % ophthalmic solution Place 1 drop into both eyes at bedtime.     Polyethyl Glycol-Propyl Glycol (SYSTANE) 0.4-0.3 % GEL ophthalmic gel Place 1 application into both eyes daily.    senna (SENOKOT) 8.6 MG TABS tablet Take 1 tablet by mouth every other day.      STOP taking these medications     docusate sodium (COLACE) 250 MG capsule      hydrochlorothiazide 25 MG tablet      Multiple Vitamins-Minerals (ICAPS MV PO)      Omega-3 Fatty Acids (FISH OIL PO)      ondansetron (ZOFRAN) 4 MG tablet      ERIVEDGE 150 MG capsule        No Known Allergies    The results of significant diagnostics from this hospitalization (including imaging, microbiology, ancillary and laboratory) are listed below for reference.    Significant Diagnostic Studies: Dg Abd 1 View  12/28/2015  CLINICAL DATA:  Nausea EXAM: ABDOMEN - 1 VIEW COMPARISON:  12/24/2015 FINDINGS: Scattered large and small bowel gas is noted. The degree of bowel gas has decreased in the interval from the prior exam. No obstructive changes are noted. Aortic calcifications are seen and stable. No free air is noted. IMPRESSION: No acute abnormality noted. The degree of gaseous distention has improved in the interval from the prior exam. Electronically Signed   By: Inez Catalina M.D.   On: 12/28/2015 12:29   Ct Head Wo Contrast  12/24/2015  CLINICAL DATA:  Nausea and vomiting for 1 month with weakness. Initial encounter. EXAM: CT HEAD WITHOUT CONTRAST TECHNIQUE: Contiguous axial images were obtained from the base of the skull through the vertex without  intravenous contrast. COMPARISON:  Brain MRI 05/01/2006.  Head CT scan 04/28/2006. FINDINGS: There is cortical atrophy and chronic microvascular ischemic change. No evidence of acute intracranial abnormality including hemorrhage, infarct, mass lesion, mass effect, midline shift or abnormal extra-axial fluid collection. No hydrocephalus or pneumocephalus. The calvarium is intact. Imaged paranasal sinuses and mastoid air cells are clear. IMPRESSION: No acute abnormality. Atrophy and chronic microvascular ischemic change. Electronically Signed   By: Inge Rise M.D.   On: 12/24/2015 19:08   Dg Chest Port 1 View  12/30/2015  CLINICAL DATA:  Healthcare associated pneumonia. Right pleural effusion. Chronic kidney disease stage 3. EXAM: PORTABLE CHEST 1 VIEW COMPARISON:  12/24/2015 FINDINGS: Moderate to large right pleural effusion appears increased in size. Right mid and lower lung atelectasis or infiltrate is also increased. Left lung remains clear. Heart size is within normal limits. Pacemaker remains in place. No pneumothorax visualized. IMPRESSION: Increased moderate to large right pleural effusion and associated atelectasis versus infiltrate. Electronically Signed   By: Earle Gell M.D.   On: 12/30/2015 13:03   Dg Abd Acute W/chest  12/24/2015  CLINICAL DATA:  Nausea and vomiting for 2 weeks. EXAM: DG ABDOMEN ACUTE W/ 1V CHEST COMPARISON:  Chest x-ray from  12/10/2015. Abdomen film from 12/10/2015. FINDINGS: AP chest shows right base collapse/consolidation with moderate right pleural effusion. Left lung is clear. The cardiopericardial silhouette is within normal limits for size. Left-sided permanent pacemaker again noted. Upright imaging shows no evidence for intraperitoneal free air. Supine film shows diffuse gaseous distention of large and small bowel. Surgical clips in the right upper quadrant suggest prior cholecystectomy. Bones are diffusely demineralized. IMPRESSION: Increasing right base  collapse/consolidation with fusion. Diffuse gaseous bowel distention. A component of ileus is suspected. Electronically Signed   By: Misty Stanley M.D.   On: 12/24/2015 19:42    Microbiology: Recent Results (from the past 240 hour(s))  Culture, blood (routine x 2) Call MD if unable to obtain prior to antibiotics being given     Status: None   Collection Time: 12/24/15 10:27 PM  Result Value Ref Range Status   Specimen Description BLOOD RIGHT ANTECUBITAL  Final   Special Requests BOTTLES DRAWN AEROBIC ONLY 4CC ONLY  Final   Culture NO GROWTH 5 DAYS  Final   Report Status 12/29/2015 FINAL  Final  Culture, blood (routine x 2) Call MD if unable to obtain prior to antibiotics being given     Status: None   Collection Time: 12/24/15 10:36 PM  Result Value Ref Range Status   Specimen Description BLOOD RIGHT ANTECUBITAL  Final   Special Requests BOTTLES DRAWN AEROBIC ONLY 4CC ONLY  Final   Culture NO GROWTH 5 DAYS  Final   Report Status 12/29/2015 FINAL  Final  MRSA PCR Screening     Status: None   Collection Time: 12/26/15  8:30 AM  Result Value Ref Range Status   MRSA by PCR NEGATIVE NEGATIVE Final    Comment:        The GeneXpert MRSA Assay (FDA approved for NASAL specimens only), is one component of a comprehensive MRSA colonization surveillance program. It is not intended to diagnose MRSA infection nor to guide or monitor treatment for MRSA infections.      Labs: Basic Metabolic Panel:  Recent Labs Lab 12/24/15 1759 12/27/15 [id number] 12/29/15 [id number] 12/30/15 0050 12/30/15 0521 12/31/15 0615  NA 142 143 146* 143  --  143  K 4.2 3.8 3.3* 3.0*  --  3.8  CL 99* 100* 101 98*  --  99*  CO2 25 30 32 36*  --  36*  GLUCOSE 116* 94 84 185*  --  99  BUN 18 12 13 13   --  12  CREATININE 1.11 1.02 1.10 1.09  --  0.97  CALCIUM 9.3 8.9 8.6* 8.6*  --  8.5*  MG  --   --   --   --  1.6*  --    Liver Function Tests:  Recent Labs Lab 12/24/15 1759 12/30/15 0050 12/31/15 0615  AST 22  23  --   ALT 20 16*  --   ALKPHOS 101 84  --   BILITOT 1.7* 0.8  --   PROT 6.6 5.7* 5.6*  ALBUMIN 3.4* 2.6*  --     Recent Labs Lab 12/24/15 1759  LIPASE 19   No results for input(s): AMMONIA in the last 168 hours. CBC:  Recent Labs Lab 12/24/15 1759 12/27/15 0628 12/30/15 0521  WBC 8.9 8.1 8.0  NEUTROABS 7.3  --   --   HGB 12.4* 11.2* 10.8*  HCT 39.5 35.7* 35.2*  MCV 94.5 93.5 94.4  PLT 167 182 175   Cardiac Enzymes:  Recent Labs Lab 12/24/15 1759 12/24/15 2236 12/25/15  [id number] 12/25/15 1035  TROPONINI 0.04* 0.04* 0.05* 0.05*   BNP: BNP (last 3 results)  Recent Labs  12/24/15 2236  BNP 29.0    ProBNP (last 3 results) No results for input(s): PROBNP in the last 8760 hours.  CBG: No results for input(s): GLUCAP in the last 168 hours.     Signed:  Tameeka Luo MD.  [facility] 12/31/2015, 2:22 PM

## 2015-12-31 NOTE — NC FL2 (Signed)
[place] LEVEL OF CARE SCREENING TOOL     IDENTIFICATION  Patient Name: Shane Simpson Birthdate: [birth year] Sex: male Admission Date (Current Location): 12/24/2015  [facility] and [place] Number:  [employer] and Address:  [facility] [address], [place]      Provider Number: [id number]  Attending Physician Name and Address:  Rexene Alberts, MD  Relative Name and Phone Number:       Current Level of Care: Hospital Recommended Level of Care: [place] Prior Approval Number:    Date Approved/Denied:   PASRR Number:   [id number]  Discharge Plan: SNF    Current Diagnoses: Patient Active Problem List   Diagnosis Date Noted  . Dysphagia 12/29/2015  . Hypernatremia 12/29/2015  . Hypokalemia 12/29/2015  . Palliative care encounter   . HCAP (healthcare-associated pneumonia) 12/24/2015  . Nausea & vomiting 12/24/2015  . Elevated troponin 12/24/2015  . Ileus ([place]) 12/24/2015  . Uncontrollable vomiting   . Pleural effusion on right   . CKD (chronic kidney disease) stage 3, GFR 30-59 ml/min 11/04/2014  . Macular degeneration 10/25/2012  . Dyspnea on exertion 03/20/2012  . Chest tightness 03/20/2012  . Sinus node dysfunction (HCC)   . PACEMAKER, PERMANENT-[employer] 02/22/2010  . Essential hypertension 08/06/2009  . INGUINAL HERNIA, RIGHT 08/06/2009  . CONSTIPATION 08/06/2009  . URINARY INCONTINENCE 08/06/2009  . SKIN CANCER, HX OF 08/06/2009  . HYPERLIPIDEMIA 07/09/2009  . BELLS PALSY 07/09/2009  . GLUCOMA 07/09/2009  . PSA, INCREASED 07/09/2009  . BENIGN PROSTATIC HYPERTROPHY, HX OF 07/09/2009    Orientation RESPIRATION BLADDER Height & Weight     Self, Time, Situation, Place  Normal Continent Weight: 161 lb 1.6 oz (73.074 kg) Height:  5\' 7"  (170.2 cm)  BEHAVIORAL SYMPTOMS/MOOD NEUROLOGICAL BOWEL NUTRITION STATUS      Continent Diet (DYS 1)  AMBULATORY STATUS COMMUNICATION OF NEEDS Skin    Limited Assist Verbally Normal                       Personal Care Assistance Level of Assistance  Bathing, Dressing Bathing Assistance: Limited assistance   Dressing Assistance: Limited assistance     Functional Limitations Info             SPECIAL CARE FACTORS FREQUENCY                       Contractures      Additional Factors Info  Code Status, Allergies               Current Medications (12/31/2015):  This is the current hospital active medication list Current Facility-Administered Medications  Medication Dose Route Frequency Provider Last Rate Last Dose  . 0.9 %  sodium chloride infusion  250 mL Intravenous PRN Erline Hau, MD      . bisacodyl (DULCOLAX) EC tablet 5 mg  5 mg Oral QODAY Phillips Grout, MD   5 mg at 12/29/15 1102  . enoxaparin (LOVENOX) injection 40 mg  40 mg Subcutaneous Q24H Phillips Grout, MD   40 mg at 12/30/15 2221  . feeding supplement (BOOST / RESOURCE BREEZE) liquid 1 Container  1 Container Oral TID BM Rexene Alberts, MD   1 Container at 12/30/15 2014  . latanoprost (XALATAN) 0.005 % ophthalmic solution 1 drop  1 drop Both Eyes QHS Phillips Grout, MD   1 drop at 12/30/15 2234  . metoCLOPramide (REGLAN)  tablet 5 mg  5 mg Oral TID AC & HS Rexene Alberts, MD   5 mg at 12/30/15 2220  . metoprolol (LOPRESSOR) tablet 50 mg  50 mg Oral BID Rexene Alberts, MD   50 mg at 12/30/15 2220  . ondansetron (ZOFRAN-ODT) disintegrating tablet 4 mg  4 mg Oral TID WC Rexene Alberts, MD   4 mg at 12/30/15 1647  . polyethylene glycol 0.4% and propylene glycol 0.3% (SYSTANE) ophthalmic gel  1 application Both Eyes Daily Estela Leonie Green, MD   1 application at [id number]  . sodium chloride 0.45 % 1,000 mL with potassium chloride 40 mEq infusion   Intravenous Continuous Rexene Alberts, MD 40 mL/hr at 12/30/15 1119    . sodium chloride flush (NS) 0.9 % injection 3 mL  3 mL Intravenous Q12H Estela Leonie Green, MD   3 mL at 12/30/15  0850  . sodium chloride flush (NS) 0.9 % injection 3 mL  3 mL Intravenous PRN Erline Hau, MD   3 mL at 12/29/15 1252  . temazepam (RESTORIL) capsule 7.5 mg  7.5 mg Oral QHS Drue Novel, NP   7.5 mg at 12/30/15 2220     Discharge Medications: Please see discharge summary for a list of discharge medications.  Relevant Imaging Results:  Relevant Lab Results:   Additional Information SSN [id number]  Ludwig Clarks, LCSW

## 2015-12-31 NOTE — Progress Notes (Signed)
Report was given to [facility] of the [place] facility.  She verbalized understanding and she was given contact information for questions for me.  The patient left the floor via stretcher with [facility] staff and his son to be transported with the packet.  Late entry. The patient left the floor in stable condition.

## 2015-12-31 NOTE — Progress Notes (Signed)
Thoracentesis complete no signs of distress. 1200 ml amber colored pleural fluid removed.  

## 2015-12-31 NOTE — Procedures (Signed)
PreOperative Dx: RT pleural effusion Postoperative Dx: RT pleural effusion Procedure:   US guided RT thoracentesis Radiologist:  Thornton Papas Anesthesia:  10 ml of 1% lidocaine Specimen:  1200 ml of amber colored fluid EBL:   < 1 ml Complications: None

## 2015-12-31 NOTE — Progress Notes (Signed)
Daily Progress Note   Patient Name: Shane Simpson       Date: 12/31/2015 DOB: 08/03/1920  Age: 82 y.o. MRN#: [id number] Attending Physician: Rexene Alberts, MD Primary Care Physician: Eulas Post, MD Admit Date: 12/24/2015  Reason for Consultation/Follow-up: Disposition, Establishing goals of care, Hospice Evaluation, Non pain symptom management and Psychosocial/spiritual support  Subjective: [name] is resting quietly in bed with son, Shane Simpson, at his bedside.  He appears to be improved, but tells me that he still does not feel well.  We talk about his nausea and he shares that he ate some of his breakfast.  We talk about his need for a fluid tap today, and that this may help him feel better.  I ask if his problem is his belly or breathing and he tells me 'both'.  He is vague regarding symptoms.     We talk about discharge, and he shares his goal to go to [facility] in a private room with private pay, no PT/OT and [facility] [place].  We talk about his use of Reglan and the hope that the removal of fluid will improve his symptoms.   I encourage him that Hospice will be able to manage his symptoms at [facility].   Length of Stay: 6 days  Current Medications: Scheduled Meds:  . bisacodyl  5 mg Oral QODAY  . enoxaparin (LOVENOX) injection  40 mg Subcutaneous Q24H  . feeding supplement (GLUCERNA SHAKE)  237 mL Oral BID BM  . latanoprost  1 drop Both Eyes QHS  . metoCLOPramide  5 mg Oral TID AC & HS  . metoprolol tartrate  50 mg Oral BID  . ondansetron  4 mg Oral TID WC  . Polyethyl Glycol-Propyl Glycol  1 application Both Eyes Daily  . sodium chloride flush  3 mL Intravenous Q12H  . temazepam  7.5 mg Oral QHS    Continuous Infusions: . sodium chloride 0.45 % with kcl 40  mL/hr at 12/30/15 1119    PRN Meds: sodium chloride, sodium chloride flush  Physical Exam: Physical Exam  Constitutional: He is oriented to person, place, and time. No distress.  Thin, frail, elderly  HENT:  Head: Normocephalic and atraumatic.  Pulmonary/Chest: Effort normal.  Abdominal: Soft. He exhibits no distension. There is no tenderness. There is no  guarding.  Neurological: He is alert and oriented to person, place, and time.  Skin: Skin is warm and dry.  Nursing note and vitals reviewed.               Vital Signs: BP 127/50 mmHg  Pulse 87  Temp(Src) 97.9 F (36.6 C) (Oral)  Resp 20  Ht 5\' 7"  (1.702 m)  Wt 73.074 kg (161 lb 1.6 oz)  BMI 25.23 kg/m2  SpO2 95% SpO2: SpO2: 95 % O2 Device: O2 Device: Nasal Cannula O2 Flow Rate: O2 Flow Rate (L/min): 3 L/min  Intake/output summary:  Intake/Output Summary (Last 24 hours) at 12/31/15 1422 Last data filed at 12/31/15 1325  Gross per 24 hour  Intake 413.83 ml  Output    200 ml  Net 213.83 ml   LBM: Last BM Date: 12/30/15 Baseline Weight: Weight: 68.04 kg (150 lb) Most recent weight: Weight: 73.074 kg (161 lb 1.6 oz)       Palliative Assessment/Data: Flowsheet Rows        Most Recent Value   Intake Tab    Referral Department  Hospitalist   Unit at Time of Referral  Med/Surg Unit   Palliative Care Primary Diagnosis  Other (Comment)   Date Notified  12/27/15   Palliative Care Type  New Palliative care   Reason for referral  Clarify Goals of Care, End of Life Care Assistance, Psychosocial or Spiritual support   Date of Admission  12/24/15   Date first seen by Palliative Care  12/27/15   # of days Palliative referral response time  0 Day(s)   # of days IP prior to Palliative referral  3   Clinical Assessment    Palliative Performance Scale Score  30%   Pain Max last 24 hours  0   Pain Min Last 24 hours  0   Dyspnea Max Last 24 Hours  0   Dyspnea Min Last 24 hours  0   Psychosocial & Spiritual Assessment     Social Work Plan of Care  Staff support   Palliative Care Outcomes    Patient/Family meeting held?  Yes   Who was at the meeting?  patient and son Shane Simpson   Palliative Care Outcomes  Improved non-pain symptom therapy, Clarified goals of care, Provided end of life care assistance, Provided advance care planning, Provided psychosocial or spiritual support   Patient/Family wishes: Interventions discontinued/not started   Mechanical Ventilation   Palliative Care follow-up planned  Yes, Facility      Additional Data Reviewed: CBC    Component Value Date/Time   WBC 8.0 12/30/2015 0521   RBC 3.73* 12/30/2015 0521   HGB 10.8* 12/30/2015 0521   HCT 35.2* 12/30/2015 0521   PLT 175 12/30/2015 0521   MCV 94.4 12/30/2015 0521   MCH 29.0 12/30/2015 0521   MCHC 30.7 12/30/2015 0521   RDW 14.6 12/30/2015 0521   LYMPHSABS 0.7 12/24/2015 1759   MONOABS 0.7 12/24/2015 1759   EOSABS 0.1 12/24/2015 1759   BASOSABS 0.0 12/24/2015 1759    CMP     Component Value Date/Time   NA 143 12/31/2015 0615   K 3.8 12/31/2015 0615   CL 99* 12/31/2015 0615   CO2 36* 12/31/2015 0615   GLUCOSE 99 12/31/2015 0615   BUN 12 12/31/2015 0615   CREATININE 0.97 12/31/2015 0615   CALCIUM 8.5* 12/31/2015 0615   PROT 5.6* 12/31/2015 0615   ALBUMIN 2.6* 12/30/2015 0050   AST 23 12/30/2015 0050   ALT 16*  12/30/2015 0050   ALKPHOS 84 12/30/2015 0050   BILITOT 0.8 12/30/2015 0050   GFRNONAA >60 12/31/2015 0615   GFRAA >60 12/31/2015 0615       Problem List:  Patient Active Problem List   Diagnosis Date Noted  . Dysphagia 12/29/2015  . Hypernatremia 12/29/2015  . Hypokalemia 12/29/2015  . Palliative care encounter   . HCAP (healthcare-associated pneumonia) 12/24/2015  . Nausea & vomiting 12/24/2015  . Elevated troponin 12/24/2015  . Ileus ([place]) 12/24/2015  . Uncontrollable vomiting   . Pleural effusion on right   . CKD (chronic kidney disease) stage 3, GFR 30-59 ml/min 11/04/2014  . Macular degeneration  10/25/2012  . Dyspnea on exertion 03/20/2012  . Chest tightness 03/20/2012  . Sinus node dysfunction (HCC)   . PACEMAKER, PERMANENT-[employer] 02/22/2010  . Essential hypertension 08/06/2009  . INGUINAL HERNIA, RIGHT 08/06/2009  . CONSTIPATION 08/06/2009  . URINARY INCONTINENCE 08/06/2009  . SKIN CANCER, HX OF 08/06/2009  . HYPERLIPIDEMIA 07/09/2009  . BELLS PALSY 07/09/2009  . GLUCOMA 07/09/2009  . PSA, INCREASED 07/09/2009  . BENIGN PROSTATIC HYPERTROPHY, HX OF 07/09/2009     Palliative Care Assessment & Plan    1.Code Status:  DNR    Code Status Orders        Start     Ordered   12/24/15 2328  Do not attempt resuscitation (DNR)   Continuous    Question Answer Comment  In the event of cardiac or respiratory ARREST Do not call a "code blue"   In the event of cardiac or respiratory ARREST Do not perform Intubation, CPR, defibrillation or ACLS   In the event of cardiac or respiratory ARREST Use medication by any route, position, wound care, and other measures to relive pain and suffering. May use oxygen, suction and manual treatment of airway obstruction as needed for comfort.      12/24/15 2327    Code Status History    Date Active Date Inactive Code Status Order ID Comments User Context   12/24/2015 10:21 PM 12/24/2015 11:27 PM Full Code [id number]  Phillips Grout, MD ED    Advance Directive Documentation        Most Recent Value   Type of Advance Directive  Healthcare Power of [place], Living will   Pre-existing out of facility DNR order (yellow form or pink MOST form)     "MOST" Form in Place?         2. Goals of Care/Additional Recommendations:  [facility] in private room/ private pay. Declines PT/OT, Treat the treatable, but focus on comfort. [facility] [place].    Limitations on Scope of Treatment: As above.   Desire for further Chaplaincy support:Ongoing  Psycho-social Needs: None at this time.   3. Symptom Management:      1.Per  hospitalist.   4. Palliative Prophylaxis:   Bowel Regimen, Frequent Pain Assessment and Turn Reposition  5. Prognosis: < 3 months, likely d/t decreased intake, frailty and desire to focus on comfort only.   6. Discharge Planning:  [place] with Hospice   Care plan was discussed with nursing staff, CM, SW, and Dr. Caryn Section.   Thank you for allowing the Palliative Medicine Team to assist in the care of this patient.   Time In: 1020 Time Out: 1045 Total Time 25 minutes Prolonged Time Billed  no         Drue Novel, NP  12/31/2015, 2:22 PM  Please contact Palliative Medicine Team phone  at [phone] for questions and concerns.

## 2015-12-31 NOTE — Telephone Encounter (Signed)
New problem   Pt is in the hospital and can't do his remote check today...FYI

## 2016-01-03 NOTE — Telephone Encounter (Signed)
Attempted to call pt back. No answer and unable to leave a message. Rescheduled remote for 01-12-16.

## 2016-01-05 LAB — CULTURE, BODY FLUID-BOTTLE: CULTURE: NO GROWTH

## 2016-01-05 LAB — CULTURE, BODY FLUID W GRAM STAIN -BOTTLE

## 2016-01-11 ENCOUNTER — Encounter: Payer: Self-pay | Admitting: Internal Medicine

## 2016-01-12 ENCOUNTER — Telehealth: Payer: Self-pay | Admitting: Cardiology

## 2016-01-12 ENCOUNTER — Encounter: Payer: Medicare Other | Admitting: *Deleted

## 2016-01-12 NOTE — Telephone Encounter (Signed)
Attempted to confirm remote transmission with pt. No answer and was unable to leave a message.   

## 2016-01-14 ENCOUNTER — Encounter: Payer: Self-pay | Admitting: Cardiology

## 2016-02-26 DEATH — deceased

## 2017-02-21 IMAGING — CT CT HEAD W/O CM
1 series · 16 of 30 positions shown, 20 images · non-contrast
Comparison: Brain MRI 05/01/2006.  Head CT scan 04/28/2006.

CLINICAL DATA: Nausea and vomiting for 1 month with weakness.
Initial encounter.

EXAM:
CT HEAD WITHOUT CONTRAST
TECHNIQUE: Contiguous axial images were obtained from the base of the skull
through the vertex without intravenous contrast.

[Series 2: headseq 4.8 h37s · axial · 0.46mm/px · z∈[+104,+237]mm · 16 of 30 slices shown, 20 images]
[im 2/30  brain]
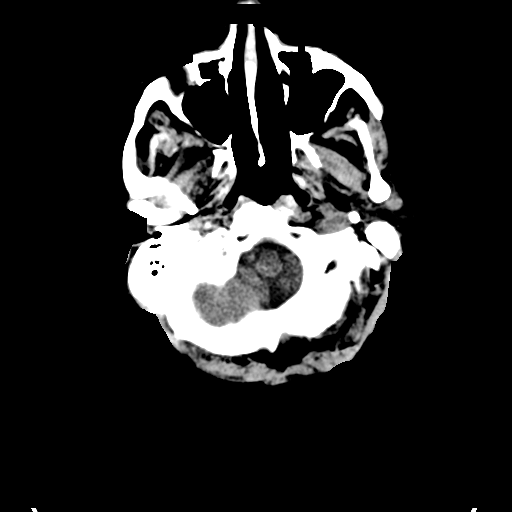
[im 2/30  bone]
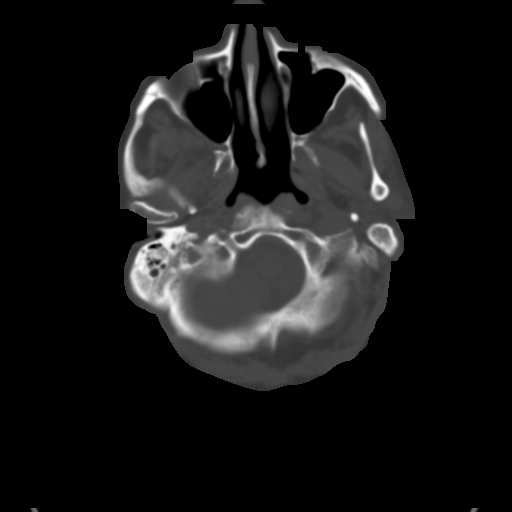
[im 4/30  brain]
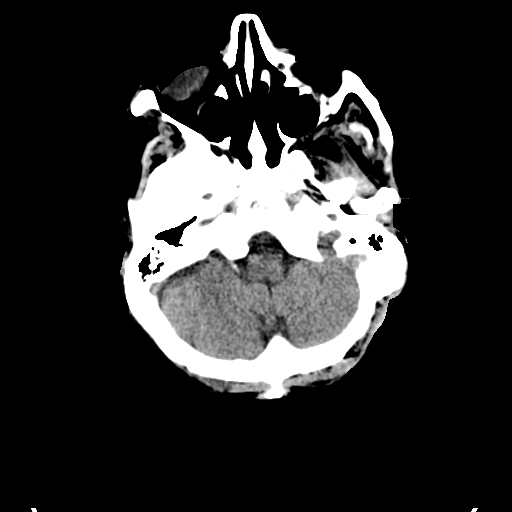
[im 6/30  brain]
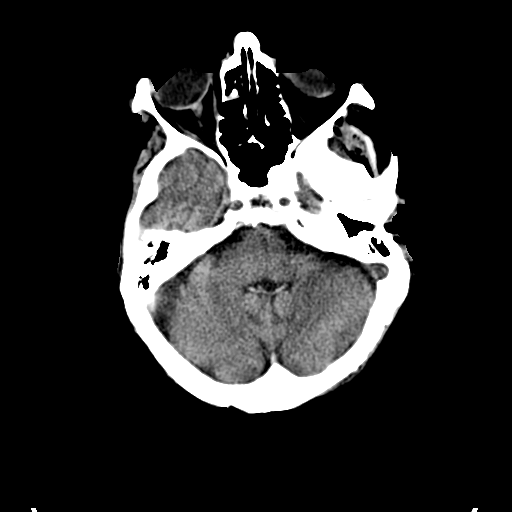
[im 8/30  brain]
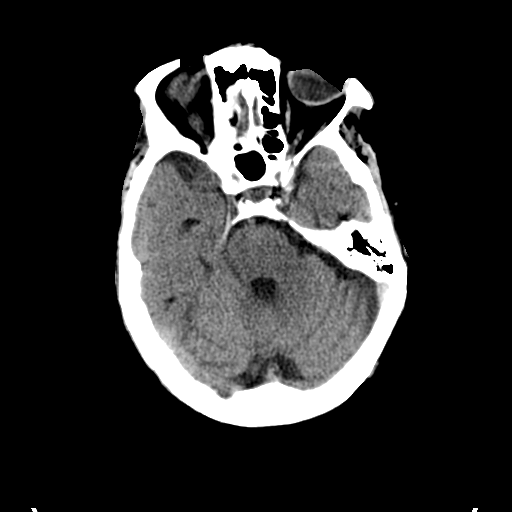
[im 9/30  brain]
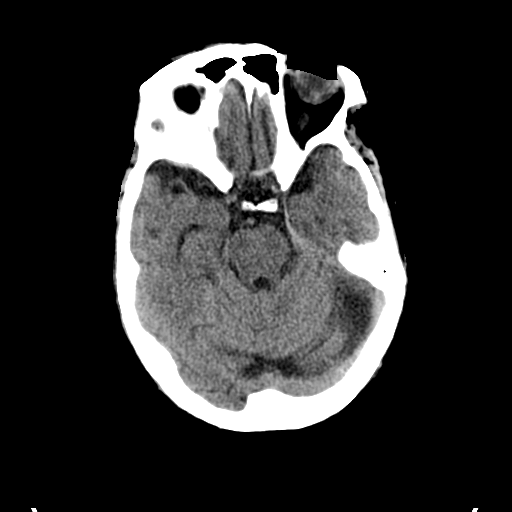
[im 9/30  bone]
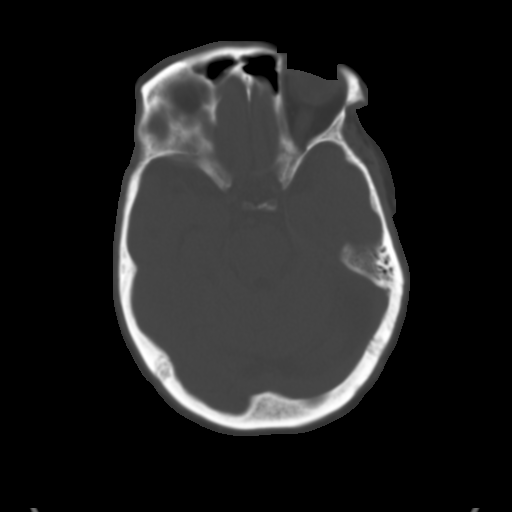
[im 11/30  brain]
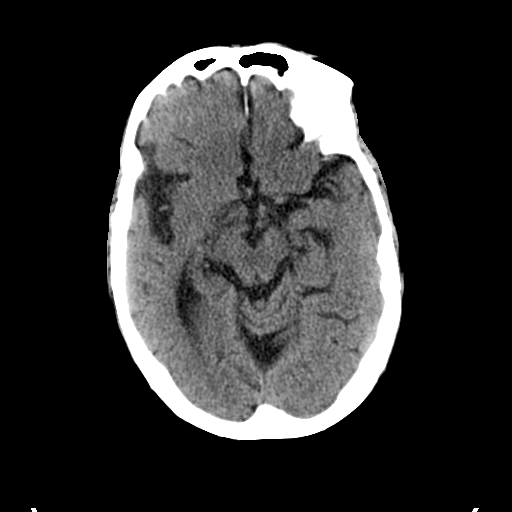
[im 13/30  brain]
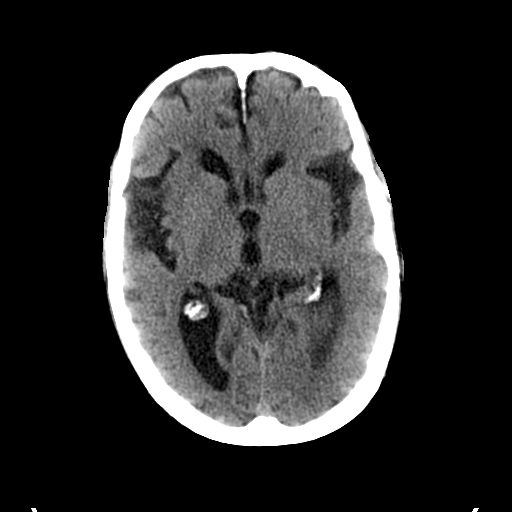
[im 15/30  brain]
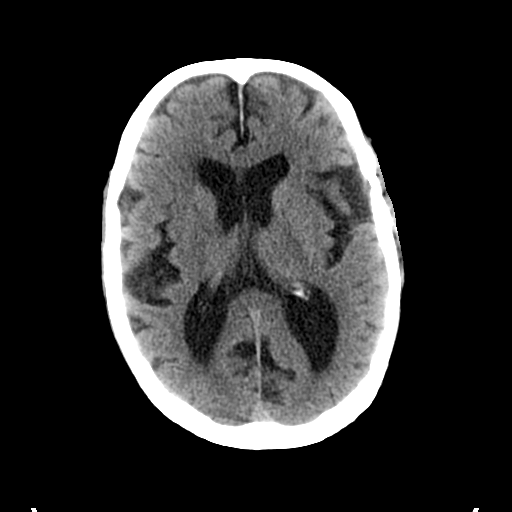
[im 16/30  brain]
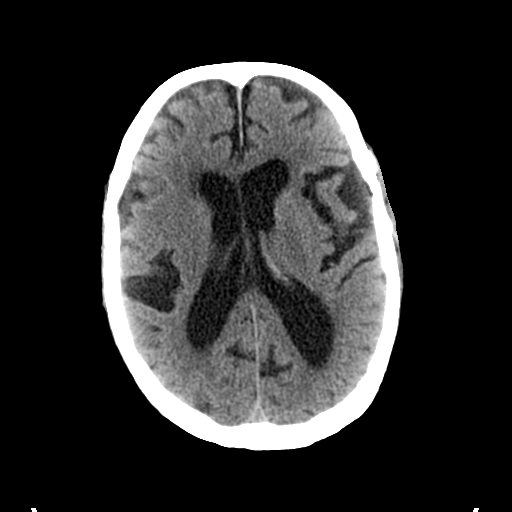
[im 16/30  bone]
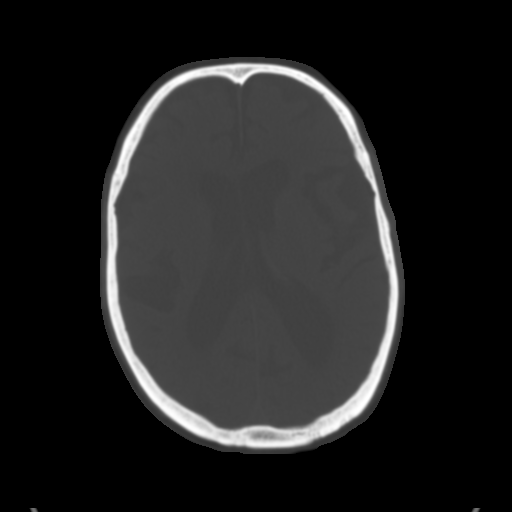
[im 18/30  brain]
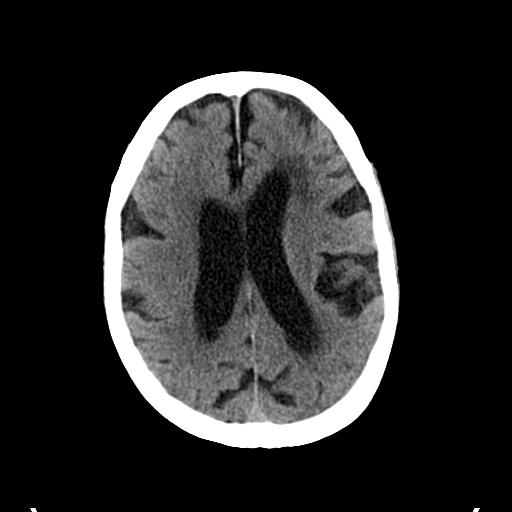
[im 20/30  brain]
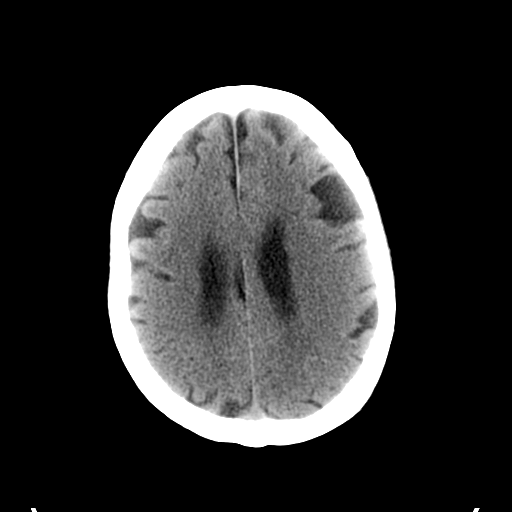
[im 22/30  brain]
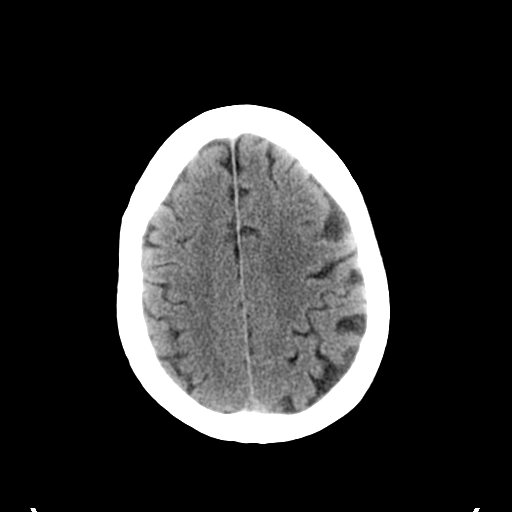
[im 23/30  brain]
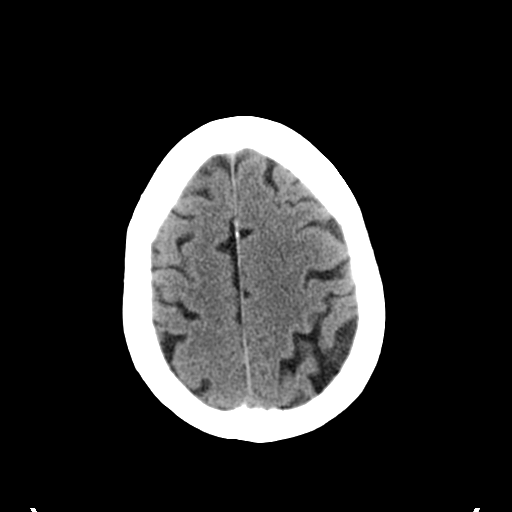
[im 23/30  bone]
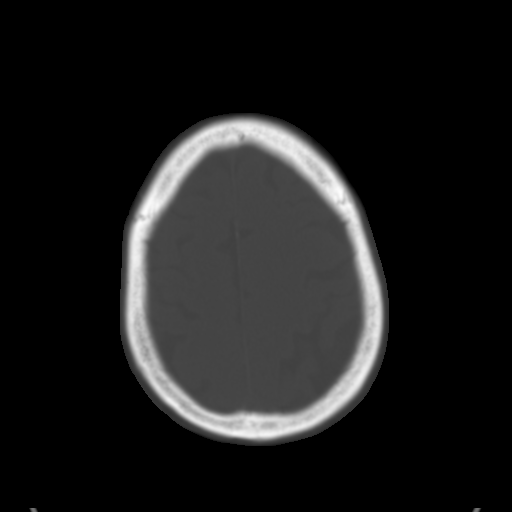
[im 25/30  brain]
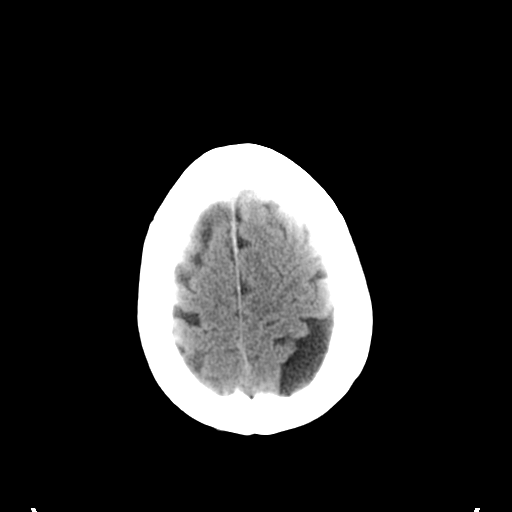
[im 27/30  brain]
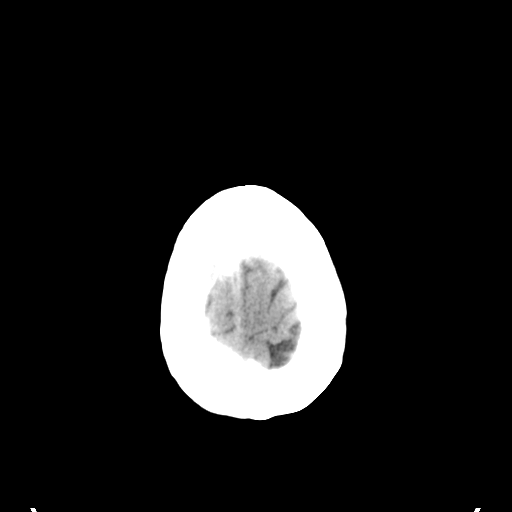
[im 29/30  brain]
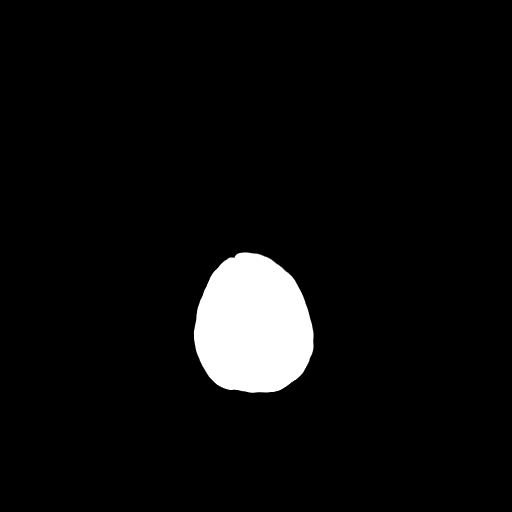

[16 of 30 positions shown; findings below may reference images not displayed]

FINDINGS: There is cortical atrophy and chronic microvascular ischemic change.
No evidence of acute intracranial abnormality including hemorrhage,
infarct, mass lesion, mass effect, midline shift or abnormal
extra-axial fluid collection. No hydrocephalus or pneumocephalus.
The calvarium is intact. Imaged paranasal sinuses and mastoid air
cells are clear.
IMPRESSION: No acute abnormality.

Atrophy and chronic microvascular ischemic change.

## 2017-02-21 IMAGING — DX DG ABDOMEN ACUTE W/ 1V CHEST
3 series · 3 of 3 positions shown · non-contrast
Comparison: Chest x-ray from 12/10/2015. Abdomen film from
12/10/2015.

CLINICAL DATA: Nausea and vomiting for 2 weeks.

EXAM:
DG ABDOMEN ACUTE W/ 1V CHEST

[chest pa]
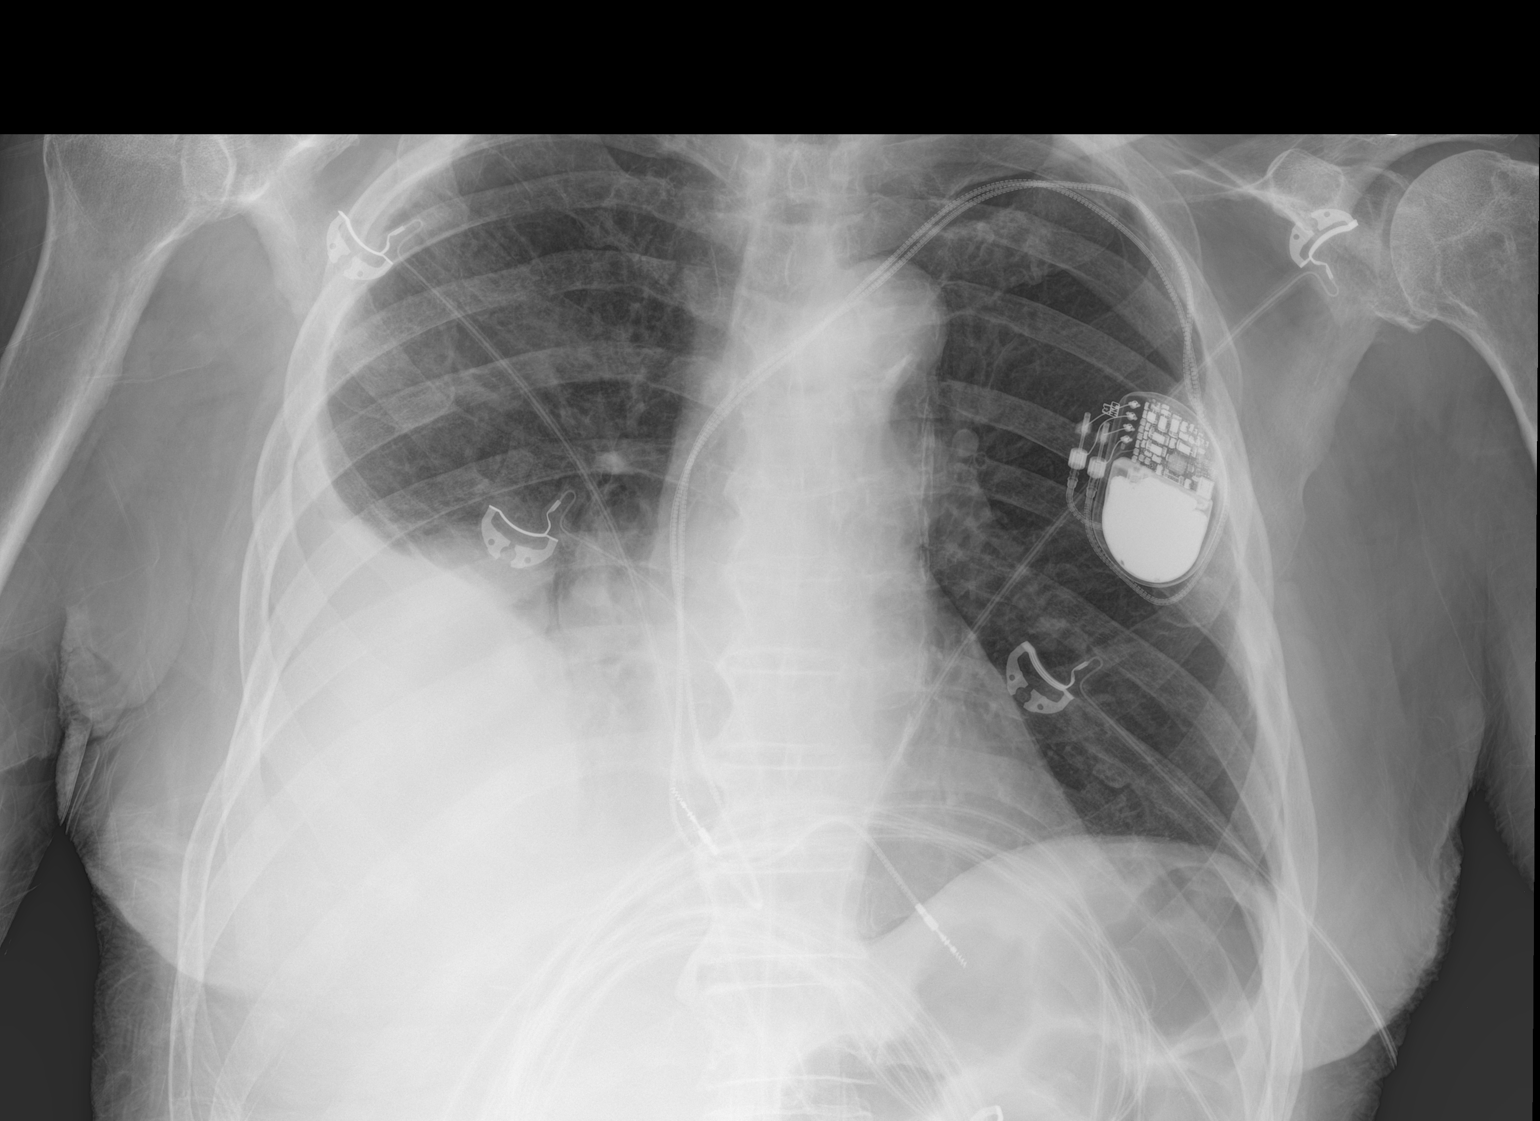

[abdomen erect]
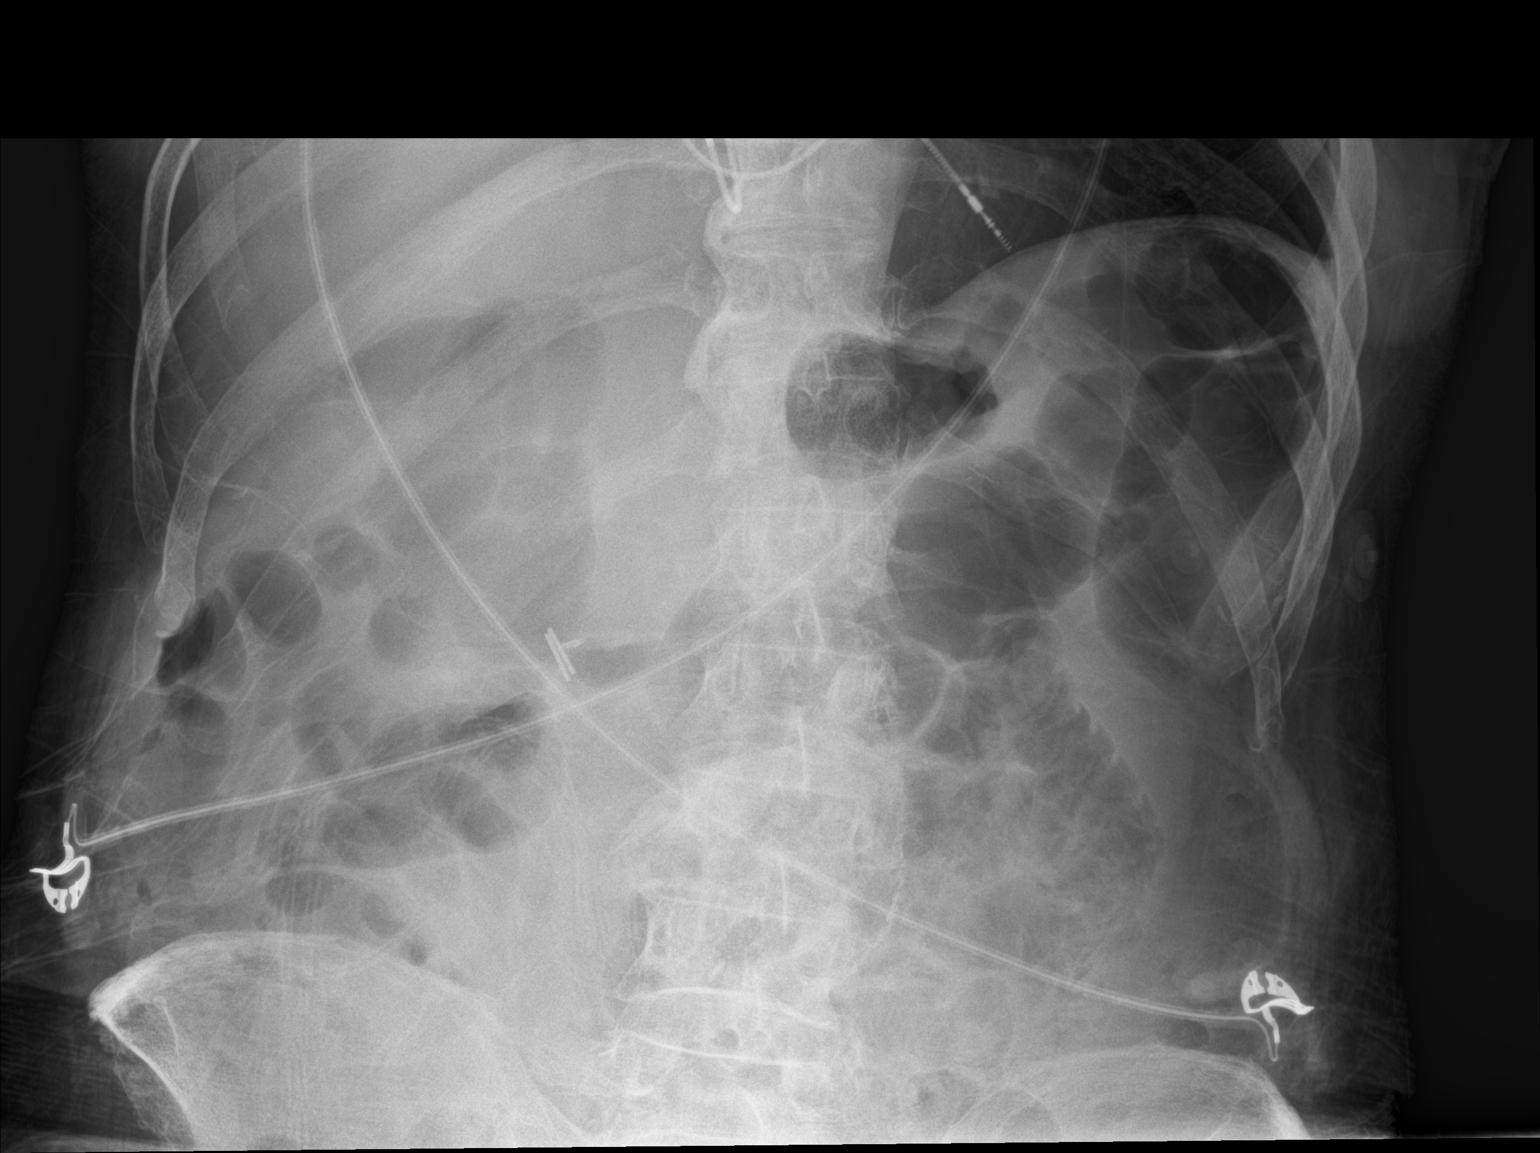

[abdomen supine]
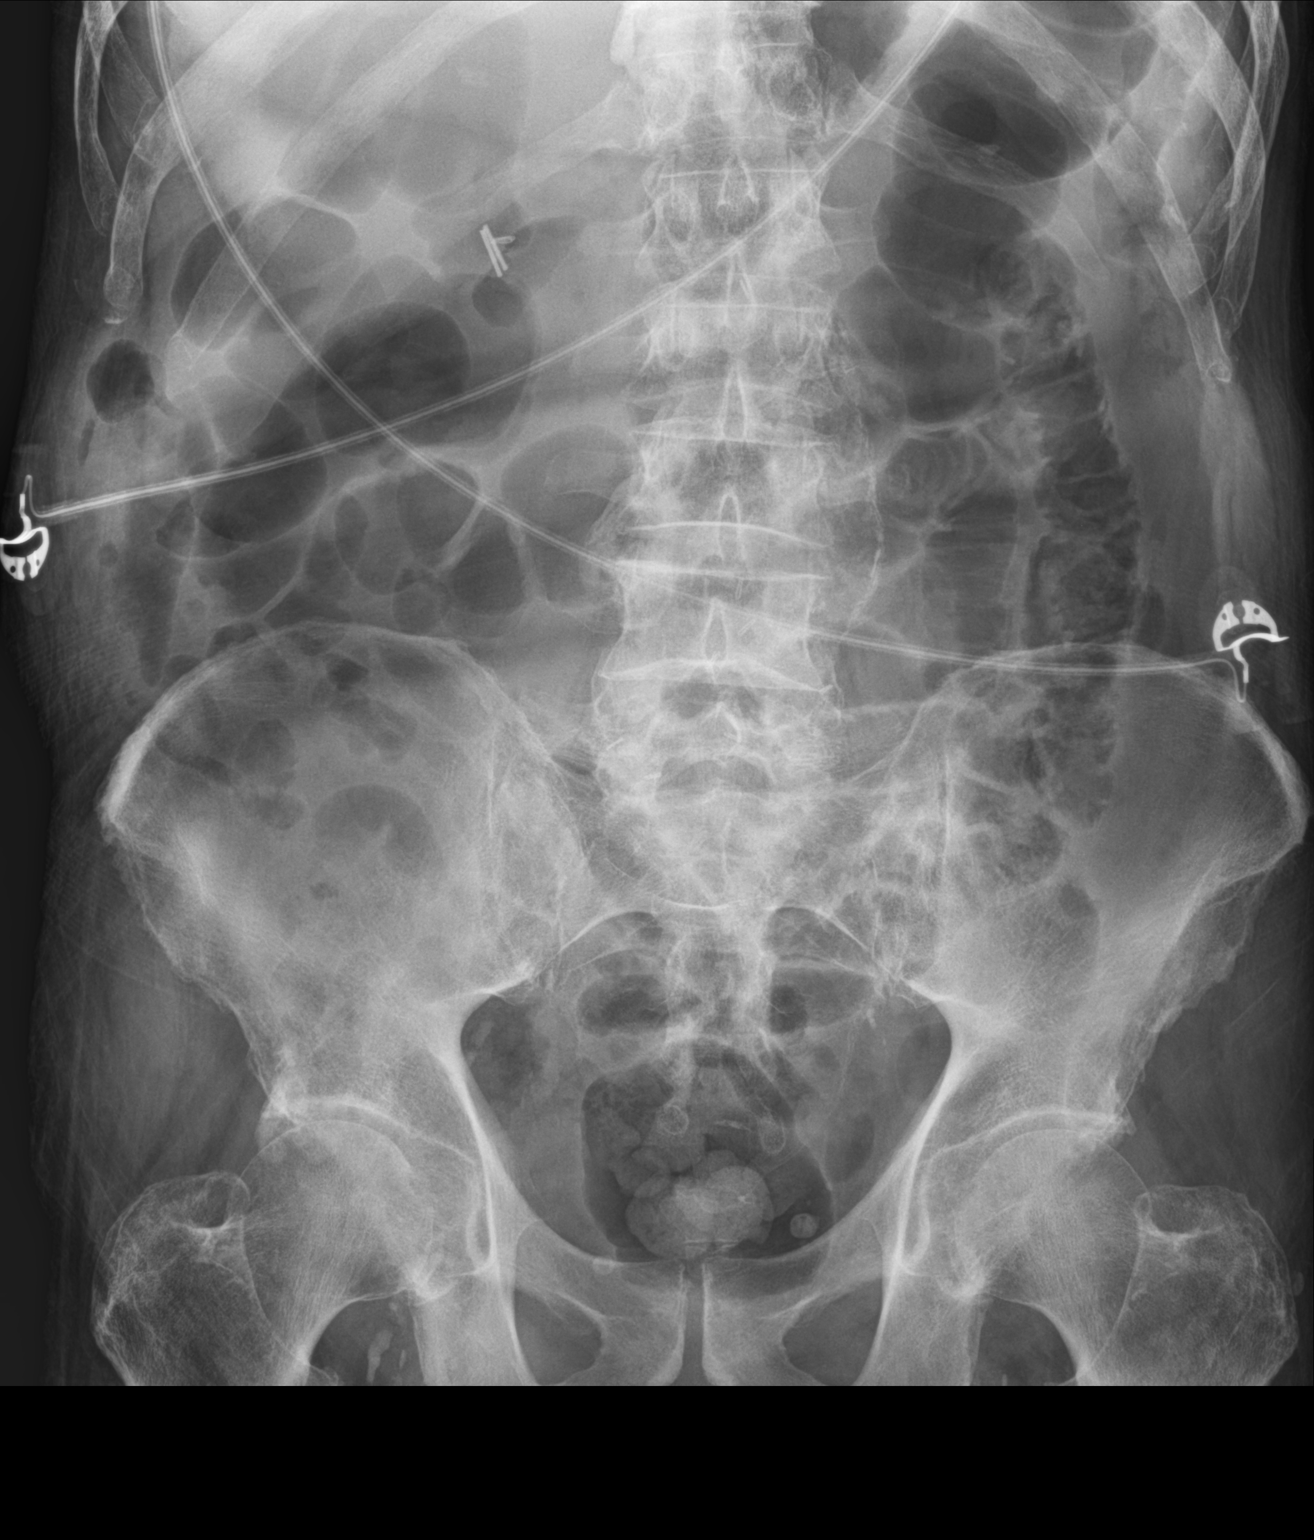

[3 of 3 positions shown; findings below may reference images not displayed]

FINDINGS: AP chest shows right base collapse/consolidation with moderate right
pleural effusion. Left lung is clear. The cardiopericardial
silhouette is within normal limits for size. Left-sided permanent
pacemaker again noted.

Upright imaging shows no evidence for intraperitoneal free air.
Supine film shows diffuse gaseous distention of large and small
bowel. Surgical clips in the right upper quadrant suggest prior
cholecystectomy. Bones are diffusely demineralized.
IMPRESSION: Increasing right base collapse/consolidation with fusion.

Diffuse gaseous bowel distention. A component of ileus is suspected.
